# Patient Record
Sex: Female | Born: 1975 | Race: Black or African American | Hispanic: No | Marital: Single | Smoking: Never smoker
Health system: Southern US, Community
[De-identification: ages and names within clinical notes are randomized; demographics above are authoritative.]

## PROBLEM LIST (undated history)

## (undated) DIAGNOSIS — H6690 Otitis media, unspecified, unspecified ear: Secondary | ICD-10-CM

## (undated) DIAGNOSIS — E119 Type 2 diabetes mellitus without complications: Secondary | ICD-10-CM

## (undated) HISTORY — PX: OTHER SURGICAL HISTORY: SHX169

## (undated) HISTORY — PX: FRACTURE SURGERY: SHX138

---

## 2013-03-12 ENCOUNTER — Encounter (HOSPITAL_COMMUNITY): Payer: Self-pay | Admitting: *Deleted

## 2013-03-12 ENCOUNTER — Emergency Department (HOSPITAL_COMMUNITY)
Admission: EM | Admit: 2013-03-12 | Discharge: 2013-03-12 | Disposition: A | Discharge status: Home / Self Care | Payer: Managed Care, Other (non HMO) | Attending: Emergency Medicine | Admitting: Emergency Medicine

## 2013-03-12 ENCOUNTER — Emergency Department (HOSPITAL_COMMUNITY): Payer: Managed Care, Other (non HMO)

## 2013-03-12 DIAGNOSIS — R52 Pain, unspecified: Secondary | ICD-10-CM | POA: Insufficient documentation

## 2013-03-12 DIAGNOSIS — Z8669 Personal history of other diseases of the nervous system and sense organs: Secondary | ICD-10-CM | POA: Insufficient documentation

## 2013-03-12 DIAGNOSIS — M25512 Pain in left shoulder: Secondary | ICD-10-CM

## 2013-03-12 DIAGNOSIS — M25519 Pain in unspecified shoulder: Secondary | ICD-10-CM | POA: Insufficient documentation

## 2013-03-12 HISTORY — DX: Otitis media, unspecified, unspecified ear: H66.90

## 2013-03-12 LAB — COMPREHENSIVE METABOLIC PANEL
AST: 12 U/L (ref 0–37)
Albumin: 3.3 g/dL — ABNORMAL LOW (ref 3.5–5.2)
Alkaline Phosphatase: 101 U/L (ref 39–117)
BUN: 8 mg/dL (ref 6–23)
Potassium: 3.7 mEq/L (ref 3.5–5.1)
Total Protein: 8.1 g/dL (ref 6.0–8.3)

## 2013-03-12 LAB — CBC WITH DIFFERENTIAL/PLATELET
Basophils Absolute: 0 10*3/uL (ref 0.0–0.1)
Basophils Relative: 0 % (ref 0–1)
Eosinophils Absolute: 0.3 10*3/uL (ref 0.0–0.7)
Hemoglobin: 10.8 g/dL — ABNORMAL LOW (ref 12.0–15.0)
MCH: 26.7 pg (ref 26.0–34.0)
MCHC: 34.2 g/dL (ref 30.0–36.0)
Monocytes Relative: 8 % (ref 3–12)
Neutrophils Relative %: 46 % (ref 43–77)
Platelets: 300 10*3/uL (ref 150–400)
RDW: 13.3 % (ref 11.5–15.5)

## 2013-03-12 LAB — POCT I-STAT TROPONIN I: Troponin i, poc: 0 ng/mL (ref 0.00–0.08)

## 2013-03-12 MED ORDER — OXYCODONE-ACETAMINOPHEN 5-325 MG PO TABS: 1.0000 | ORAL_TABLET | Freq: Four times a day (QID) | ORAL | Status: DC | PRN

## 2013-03-12 MED ORDER — OXYCODONE-ACETAMINOPHEN 5-325 MG PO TABS
2.0000 | ORAL_TABLET | Freq: Once | ORAL | Status: AC
  Administered 2013-03-12: 2 via ORAL
  Filled 2013-03-12: qty 2

## 2013-03-12 MED ORDER — IBUPROFEN 800 MG PO TABS: 800.0000 mg | ORAL_TABLET | Freq: Three times a day (TID) | ORAL | Status: DC

## 2013-03-12 NOTE — ED Provider Notes (Signed)
CSN: [id number]     Arrival date & time 03/12/13  0004 History     First MD Initiated Contact with Patient 03/12/13 0110     Chief Complaint  Patient presents with  . Chest Pain   (Consider location/radiation/quality/duration/timing/severity/associated sxs/prior Treatment) Patient is a 37 y.o. female presenting with chest pain.  Chest Pain   Pt reports several hours of moderate to severe aching pain in L chest radiating into her back, worse with deep breath. States she had similar pain before attributed to ear infection and then clarifies that her pain is not really in her chest but more in her L shoulder. She denies any SOB or fever. No ear drainage but some recent sore throat. She denies any falls or injury to the shoulder. States she has taken motrin with no improvement.   Past Medical History  Diagnosis Date  . Ear infection    Past Surgical History  Procedure Laterality Date  . Arm surgery      right    History reviewed. No pertinent family history. History  Substance Use Topics  . Smoking status: Never Smoker   . Smokeless tobacco: Not on file  . Alcohol Use: No   OB History   Grav Para Term Preterm Abortions TAB SAB Ect Mult Living                 Review of Systems  Cardiovascular: Positive for chest pain.   All other systems reviewed and are negative except as noted in HPI.   Allergies  Shellfish allergy  Home Medications   Current Outpatient Rx  Name  Route  Sig  Dispense  Refill  . ibuprofen (ADVIL,MOTRIN) 200 MG tablet   Oral   Take 400 mg by mouth every 6 (six) hours as needed for pain.          BP 118/83  Pulse 88  Temp(Src) 98.2 F (36.8 C) (Oral)  Resp 16  SpO2 100%  LMP 02/26/2013 Physical Exam  Nursing note and vitals reviewed. Constitutional: She is oriented to person, place, and time. She appears well-developed and well-nourished.  HENT:  Head: Normocephalic and atraumatic.  Eyes: EOM are normal. Pupils are equal, round, and  reactive to light.  Neck: Normal range of motion. Neck supple.  Cardiovascular: Normal rate, normal heart sounds and intact distal pulses.   Pulmonary/Chest: Effort normal and breath sounds normal.  Abdominal: Bowel sounds are normal. She exhibits no distension. There is no tenderness.  Musculoskeletal: Normal range of motion. She exhibits tenderness (tender over soft tissues of L shoulder, cervical and thoracic paraspinal muscles). She exhibits no edema.  Neurological: She is alert and oriented to person, place, and time. She has normal strength. No cranial nerve deficit or sensory deficit.  Skin: Skin is warm and dry. No rash noted.  Psychiatric: She has a normal mood and affect.    ED Course   Procedures (including critical care time)  Labs Reviewed  CBC WITH DIFFERENTIAL - Abnormal; Notable for the following:    Hemoglobin 10.8 (*)    HCT 31.6 (*)    All other components within normal limits  COMPREHENSIVE METABOLIC PANEL - Abnormal; Notable for the following:    Albumin 3.3 (*)    All other components within normal limits  POCT I-STAT TROPONIN I   Dg Chest 2 View  03/12/2013   *RADIOLOGY REPORT*  Clinical Data: Chest pain and shortness of breath tonight.  CHEST - 2 VIEW  Comparison: None.  Findings:  Shallow inspiration. The heart size and pulmonary vascularity are normal. The lungs appear clear and expanded without focal air space disease or consolidation. No blunting of the costophrenic angles.  No pneumothorax.  Mediastinal contours appear intact.  IMPRESSION: Shallow inspiration.  No evidence of active pulmonary disease.   Original Report Authenticated By: Burman Nieves, M.D.   1. Shoulder pain, acute, left     MDM   Date: 03/12/2013  Rate: 83  Rhythm: normal sinus rhythm  QRS Axis: normal  Intervals: normal  ST/T Wave abnormalities: normal  Conduction Disutrbances: none  Narrative Interpretation: unremarkable  Pt with clear musculoskeletal shoulder pain does not  actually have any chest pain. Labs and images ordered in triage reviewed and unremarkable. Will give pain medications, advised continued NSAIDs, PCP followup for recheck.     Olyvia Gopal B. Bernette Mayers, MD 03/12/13 1610

## 2013-03-12 NOTE — ED Notes (Signed)
MD at bedside. 

## 2013-03-12 NOTE — ED Notes (Signed)
Pt states CP that started today and got worse this evening. Pt states pain is left chest and radiates through to her back. Pt states that the pain gets worse with movement and pressure.

## 2013-03-12 NOTE — ED Notes (Signed)
Patient has history of inner ear infections, no history of heart disease or diabetes,

## 2013-07-22 ENCOUNTER — Encounter (HOSPITAL_COMMUNITY): Payer: Self-pay | Admitting: *Deleted

## 2013-07-30 ENCOUNTER — Encounter (HOSPITAL_COMMUNITY): Payer: Self-pay | Admitting: Pharmacist

## 2013-07-30 ENCOUNTER — Other Ambulatory Visit: Payer: Self-pay | Admitting: Obstetrics and Gynecology

## 2013-07-31 ENCOUNTER — Encounter: Payer: Managed Care, Other (non HMO) | Attending: *Deleted | Admitting: *Deleted

## 2013-08-06 ENCOUNTER — Encounter (HOSPITAL_COMMUNITY)
Admission: RE | Admit: 2013-08-06 | Discharge: 2013-08-06 | Disposition: A | Discharge status: Home / Self Care | Payer: Managed Care, Other (non HMO) | Source: Ambulatory Visit | Attending: Obstetrics and Gynecology | Admitting: Obstetrics and Gynecology

## 2013-08-06 ENCOUNTER — Inpatient Hospital Stay (HOSPITAL_COMMUNITY): Admission: RE | Admit: 2013-08-06 | Payer: Managed Care, Other (non HMO) | Source: Ambulatory Visit

## 2013-08-06 ENCOUNTER — Encounter (INDEPENDENT_AMBULATORY_CARE_PROVIDER_SITE_OTHER): Payer: Self-pay

## 2013-08-06 ENCOUNTER — Encounter (HOSPITAL_COMMUNITY): Payer: Self-pay

## 2013-08-06 DIAGNOSIS — Z01818 Encounter for other preprocedural examination: Secondary | ICD-10-CM | POA: Insufficient documentation

## 2013-08-06 DIAGNOSIS — Z01812 Encounter for preprocedural laboratory examination: Secondary | ICD-10-CM | POA: Insufficient documentation

## 2013-08-06 LAB — BASIC METABOLIC PANEL
BUN: 8 mg/dL (ref 6–23)
CALCIUM: 9.3 mg/dL (ref 8.4–10.5)
CO2: 27 meq/L (ref 19–32)
CREATININE: 0.79 mg/dL (ref 0.50–1.10)
Chloride: 103 mEq/L (ref 96–112)
GFR calc Af Amer: >90 mL/min (ref >90)
GLUCOSE: 81 mg/dL (ref 70–99)
Potassium: 3.8 mEq/L (ref 3.7–5.3)
SODIUM: 142 meq/L (ref 137–147)

## 2013-08-06 LAB — CBC
HCT: 29.3 % — ABNORMAL LOW (ref 36.0–46.0)
Hemoglobin: 10 g/dL — ABNORMAL LOW (ref 12.0–15.0)
MCH: 27 pg (ref 26.0–34.0)
MCHC: 34.1 g/dL (ref 30.0–36.0)
MCV: 79 fL (ref 78.0–100.0)
PLATELETS: 311 10*3/uL (ref 150–400)
RBC: 3.71 MIL/uL — AB (ref 3.87–5.11)
RDW: 13 % (ref 11.5–15.5)
WBC: 7.2 10*3/uL (ref 4.0–10.5)

## 2013-08-06 NOTE — Patient Instructions (Signed)
21 Pamela Cowan  08/06/2013   Your procedure is scheduled on:  08/07/13  Enter through the Main Entrance of [facility] at 815 AM.  Pick up the phone at the desk and dial [phone].   Call this number if you have problems the morning of surgery: [phone]   Remember:   Do not eat food:After Midnight.  Do not drink clear liquids: After Midnight.  Take these medicines the morning of surgery with A SIP OF WATER: NA   Do not wear jewelry, make-up or nail polish.  Do not wear lotions, powders, or perfumes. You may wear deodorant.  Do not shave 48 hours prior to surgery.  Do not bring valuables to the hospital.  [facility] is not   responsible for any belongings or valuables brought to the hospital.  Contacts, dentures or bridgework may not be worn into surgery.  Leave suitcase in the car. After surgery it may be brought to your room.  For patients admitted to the hospital, checkout time is 11:00 AM the day of              discharge.   Patients discharged the day of surgery will not be allowed to drive             home.  Name and phone number of your driver: mother   Pamela Cowan  Special Instructions:   Shower using CHG 2 nights before surgery and the night before surgery.  If you shower the day of surgery use CHG.  Use special wash - you have one bottle of CHG for all showers.  You should use approximately 1/3 of the bottle for each shower.   Please read over the following fact sheets that you were given:   Surgical Site Infection Prevention

## 2013-08-07 ENCOUNTER — Encounter (HOSPITAL_COMMUNITY): Admission: RE | Payer: Self-pay | Source: Ambulatory Visit

## 2013-08-07 ENCOUNTER — Ambulatory Visit (HOSPITAL_COMMUNITY)
Admission: RE | Admit: 2013-08-07 | Payer: Managed Care, Other (non HMO) | Source: Ambulatory Visit | Admitting: Obstetrics and Gynecology

## 2013-08-07 HISTORY — DX: Type 2 diabetes mellitus without complications: E11.9

## 2013-08-07 SURGERY — DILATATION & CURETTAGE/HYSTEROSCOPY WITH RESECTOCOPE
Anesthesia: Choice

## 2013-09-01 ENCOUNTER — Encounter (HOSPITAL_COMMUNITY): Payer: Self-pay | Admitting: Emergency Medicine

## 2013-09-01 ENCOUNTER — Emergency Department (HOSPITAL_COMMUNITY)
Admission: EM | Admit: 2013-09-01 | Discharge: 2013-09-01 | Disposition: A | Discharge status: Home / Self Care | Payer: Managed Care, Other (non HMO) | Attending: Emergency Medicine | Admitting: Emergency Medicine

## 2013-09-01 DIAGNOSIS — E119 Type 2 diabetes mellitus without complications: Secondary | ICD-10-CM | POA: Insufficient documentation

## 2013-09-01 DIAGNOSIS — Z3202 Encounter for pregnancy test, result negative: Secondary | ICD-10-CM | POA: Insufficient documentation

## 2013-09-01 DIAGNOSIS — N12 Tubulo-interstitial nephritis, not specified as acute or chronic: Secondary | ICD-10-CM | POA: Insufficient documentation

## 2013-09-01 DIAGNOSIS — Z8669 Personal history of other diseases of the nervous system and sense organs: Secondary | ICD-10-CM | POA: Insufficient documentation

## 2013-09-01 DIAGNOSIS — Z79899 Other long term (current) drug therapy: Secondary | ICD-10-CM | POA: Insufficient documentation

## 2013-09-01 DIAGNOSIS — N390 Urinary tract infection, site not specified: Secondary | ICD-10-CM | POA: Insufficient documentation

## 2013-09-01 LAB — URINE MICROSCOPIC-ADD ON

## 2013-09-01 LAB — URINALYSIS, ROUTINE W REFLEX MICROSCOPIC
BILIRUBIN URINE: NEGATIVE
Glucose, UA: NEGATIVE mg/dL
KETONES UR: NEGATIVE mg/dL
NITRITE: NEGATIVE
PROTEIN: NEGATIVE mg/dL
Specific Gravity, Urine: 1.016 (ref 1.005–1.030)
UROBILINOGEN UA: 0.2 mg/dL (ref 0.0–1.0)
pH: 5.5 (ref 5.0–8.0)

## 2013-09-01 LAB — POCT PREGNANCY, URINE: Preg Test, Ur: NEGATIVE

## 2013-09-01 MED ORDER — IBUPROFEN 800 MG PO TABS
800.0000 mg | ORAL_TABLET | Freq: Once | ORAL | Status: AC
  Administered 2013-09-01: 800 mg via ORAL
  Filled 2013-09-01: qty 1

## 2013-09-01 MED ORDER — CIPROFLOXACIN HCL 500 MG PO TABS: 500.0000 mg | ORAL_TABLET | Freq: Two times a day (BID) | ORAL | Status: DC

## 2013-09-01 MED ORDER — HYDROCODONE-ACETAMINOPHEN 5-325 MG PO TABS: 1.0000 | ORAL_TABLET | Freq: Four times a day (QID) | ORAL | Status: DC | PRN

## 2013-09-01 NOTE — Discharge Instructions (Signed)
Urinary Tract Infection  Urinary tract infections (UTIs) can develop anywhere along your urinary tract. Your urinary tract is your body's drainage system for removing wastes and extra water. Your urinary tract includes two kidneys, two ureters, a bladder, and a urethra. Your kidneys are a pair of bean-shaped organs. Each kidney is about the size of your fist. They are located below your ribs, one on each side of your spine.  CAUSES  Infections are caused by microbes, which are microscopic organisms, including fungi, viruses, and bacteria. These organisms are so small that they can only be seen through a microscope. Bacteria are the microbes that most commonly cause UTIs.  SYMPTOMS   Symptoms of UTIs may vary by age and gender of the patient and by the location of the infection. Symptoms in young women typically include a frequent and intense urge to urinate and a painful, burning feeling in the bladder or urethra during urination. Older women and men are more likely to be tired, shaky, and weak and have muscle aches and abdominal pain. A fever may mean the infection is in your kidneys. Other symptoms of a kidney infection include pain in your back or sides below the ribs, nausea, and vomiting.  DIAGNOSIS  To diagnose a UTI, your caregiver will ask you about your symptoms. Your caregiver also will ask to provide a urine sample. The urine sample will be tested for bacteria and white blood cells. White blood cells are made by your body to help fight infection.  TREATMENT   Typically, UTIs can be treated with medication. Because most UTIs are caused by a bacterial infection, they usually can be treated with the use of antibiotics. The choice of antibiotic and length of treatment depend on your symptoms and the type of bacteria causing your infection.  HOME CARE INSTRUCTIONS   If you were prescribed antibiotics, take them exactly as your caregiver instructs you. Finish the medication even if you feel better after you  have only taken some of the medication.   Drink enough water and fluids to keep your urine clear or pale yellow.   Avoid caffeine, tea, and carbonated beverages. They tend to irritate your bladder.   Empty your bladder often. Avoid holding urine for long periods of time.   Empty your bladder before and after sexual intercourse.   After a bowel movement, women should cleanse from front to back. Use each tissue only once.  SEEK MEDICAL CARE IF:    You have back pain.   You develop a fever.   Your symptoms do not begin to resolve within 3 days.  SEEK IMMEDIATE MEDICAL CARE IF:    You have severe back pain or lower abdominal pain.   You develop chills.   You have nausea or vomiting.   You have continued burning or discomfort with urination.  MAKE SURE YOU:    Understand these instructions.   Will watch your condition.   Will get help right away if you are not doing well or get worse.  Document Released: 04/20/2005 Document Revised: 01/10/2012 Document Reviewed: 08/19/2011  Exit[employer] Patient Information 2014 [employer].

## 2013-09-01 NOTE — ED Notes (Signed)
Signature pad in room not working, pt states she understands discharge instructions and has no further questions at this time.

## 2013-09-01 NOTE — ED Notes (Signed)
Pt presents to department for evaluation of lower back pain and dark colored urine. Ongoing x1 day. Pt concerned that she might have UTI. Denies hematuria/dysuria. 5/10 pain at the time. Pt is alert and oriented x4. No signs of distress noted.

## 2013-09-01 NOTE — ED Provider Notes (Signed)
CSN: [id number]     Arrival date & time 09/01/13  1740 History   First MD Initiated Contact with Patient 09/01/13 1841     Chief Complaint  Patient presents with  . Back Pain   (Consider location/radiation/quality/duration/timing/severity/associated sxs/prior Treatment) Patient is a 38 y.o. female presenting with back pain. The history is provided by the patient.  Back Pain Location:  Lumbar spine Quality:  Aching Radiates to:  Does not radiate Pain severity:  Mild Pain is:  Same all the time Onset quality:  Gradual Duration:  1 day Timing:  Constant Progression:  Worsening Chronicity:  Recurrent Context: not falling and not recent injury   Relieved by:  Nothing Worsened by:  Nothing tried Associated symptoms: abdominal pain   Associated symptoms: no dysuria and no fever     Past Medical History  Diagnosis Date  . Ear infection   . Diabetes mellitus without complication     pt just put on metformin   Past Surgical History  Procedure Laterality Date  . Arm surgery      right    No family history on file. History  Substance Use Topics  . Smoking status: Never Smoker   . Smokeless tobacco: Not on file  . Alcohol Use: No   OB History   Grav Para Term Preterm Abortions TAB SAB Ect Mult Living                 Review of Systems  Constitutional: Negative for fever.  Respiratory: Negative for cough and shortness of breath.   Gastrointestinal: Positive for abdominal pain. Negative for nausea, vomiting and diarrhea.  Genitourinary: Positive for vaginal discharge. Negative for dysuria and vaginal bleeding.  Musculoskeletal: Positive for back pain.  All other systems reviewed and are negative.    Allergies  Shellfish allergy  Home Medications   Current Outpatient Rx  Name  Route  Sig  Dispense  Refill  . ciprofloxacin (CIPRO) 500 MG tablet   Oral   Take 1 tablet (500 mg total) by mouth 2 (two) times daily. One po bid x 7 days   20 tablet   0   .  HYDROcodone-acetaminophen (NORCO/VICODIN) 5-325 MG per tablet   Oral   Take 1 tablet by mouth every 6 (six) hours as needed for moderate pain.   20 tablet   0   . ibuprofen (ADVIL,MOTRIN) 200 MG tablet   Oral   Take 400 mg by mouth every 6 (six) hours as needed for headache.          . metFORMIN (GLUCOPHAGE) 500 MG tablet   Oral   Take 500 mg by mouth daily before supper.          BP 126/86  Pulse 79  Temp(Src) 98.5 F (36.9 C) (Oral)  Resp 18  Ht 5\' 6"  (1.676 m)  Wt 276 lb (125.193 kg)  BMI 44.57 kg/m2  SpO2 100%  LMP 07/29/2013 Physical Exam  Nursing note and vitals reviewed. Constitutional: She is oriented to person, place, and time. She appears well-developed and well-nourished. No distress.  HENT:  Head: Normocephalic and atraumatic.  Eyes: EOM are normal. Pupils are equal, round, and reactive to light.  Neck: Normal range of motion. Neck supple.  Cardiovascular: Normal rate and regular rhythm.  Exam reveals no friction rub.   No murmur heard. Pulmonary/Chest: Effort normal and breath sounds normal. No respiratory distress. She has no wheezes. She has no rales.  Abdominal: Soft. She exhibits no distension. There is  tenderness (mild CVA tenderness on left). There is no rebound.  Musculoskeletal: Normal range of motion. She exhibits no edema.  Neurological: She is alert and oriented to person, place, and time. No cranial nerve deficit. She exhibits normal muscle tone. Coordination normal.  Skin: No rash noted. She is not diaphoretic.    ED Course  Procedures (including critical care time) Labs Review Labs Reviewed  URINALYSIS, ROUTINE W REFLEX MICROSCOPIC - Abnormal; Notable for the following:    APPearance CLOUDY (*)    Hgb urine dipstick TRACE (*)    Leukocytes, UA LARGE (*)    All other components within normal limits  URINE MICROSCOPIC-ADD ON - Abnormal; Notable for the following:    Squamous Epithelial / LPF FEW (*)    Bacteria, UA MANY (*)    All  other components within normal limits  URINE CULTURE  POCT PREGNANCY, URINE   Imaging Review No results found.  EKG Interpretation   None       MDM   1. UTI (lower urinary tract infection)   2. Pyelonephritis    44F here with L sided back pain. No fever, no N/V/D. States dark urine. She is concerned about UTI. Hx of similar. Also having vaginal discharge. Denies belly pain. L CVA tenderness on exam. Stable vitals. Urine shows UTI. Given Cipro. I offered pelvic for her vaginal discharge, she refused, has f/u with her OB/GYN this week. Given Cipro and vicodin. Stable for discharge.     Dagmar HaitWilliam Michelle Vanhise, MD 09/01/13 [phone]

## 2013-09-02 LAB — URINE CULTURE: Colony Count: >=100000

## 2013-11-19 ENCOUNTER — Encounter: Payer: Managed Care, Other (non HMO) | Attending: Obstetrics and Gynecology | Admitting: Dietician

## 2013-11-19 VITALS — Ht 69.0 in | Wt 281.1 lb

## 2013-11-19 DIAGNOSIS — Z6841 Body Mass Index (BMI) 40.0 and over, adult: Secondary | ICD-10-CM | POA: Insufficient documentation

## 2013-11-19 DIAGNOSIS — Z833 Family history of diabetes mellitus: Secondary | ICD-10-CM | POA: Insufficient documentation

## 2013-11-19 DIAGNOSIS — E669 Obesity, unspecified: Secondary | ICD-10-CM | POA: Insufficient documentation

## 2013-11-19 DIAGNOSIS — Z713 Dietary counseling and surveillance: Secondary | ICD-10-CM | POA: Insufficient documentation

## 2013-11-19 NOTE — Patient Instructions (Addendum)
-  Try to start eating breakfast and having snacks throughout the day  [place] yogurt, protein bars, cottage cheese and fruit, nuts (refer to snack list) -Fill up on non-starchy vegetables (any veggie except corn, peas, or potatoes) -Practice mindful eating: pay attention to early hunger signals, stop eating once you aren't hungry anymore, practice eating slowly -Self care: take a walk, listening to music, hanging out with new friends (brainstorm some other ways to relax) -Look into a gym with Zumba classes [employer] and [employer])

## 2013-11-19 NOTE — Progress Notes (Signed)
  Medical Nutrition Therapy:  Appt start time: 1500 end time:  1600.   Assessment:  Primary concerns today: "Pamela Cowan"  Is here today because her doctor referred her for weight loss. She states that diabetes runs in her family and she wants to prevent the onset of diabetes. Pam[place] Cow[place], [place]. In the past few months she has lost 3 people close to her. She works at [employer] and [employer]. She states she is an emotional eater with both negative and positive emotions.   Preferred Learning Style:  No preference indicated   Learning Readiness:  Contemplating  Ready  MEDICATIONS: see list   DIETARY INTAKE:  Often does not eat until 2pm.  24-hr recall:  B ( AM): none  Snk ( AM): none  L (2 PM): spicy chicken sandwich and fries Snk ( PM): candy, chips, and other "junk food"  Beverages: juice, water  Usual physical activity: none  Estimated energy needs: 1800 calories 200 g carbohydrates   Progress Towards Goal(s):  No progress.   Nutritional Diagnosis:  [place]-3.3 Overweight/obesity As related to inappropriate food choices and physical inactivity.  As evidenced by BMI 41.6.    Intervention:  Nutrition counseling provided. Explained etiology and methods of preventing onset of diabetes. -Try to start eating breakfast and having snacks throughout the day  [place] yogurt, protein bars, cottage cheese and fruit, nuts (refer to snack list) -Fill up on non-starchy vegetables (any veggie except corn, peas, or potatoes) -Practice mindful eating: pay attention to early hunger signals, stop eating once you aren't hungry anymore, practice eating slowly -Self care: take a walk, listening to music, hanging out with new friends (brainstorm some other ways to relax) -Look into a gym with Zumba classes Engineer, [employer] and [employer])  Teaching Method Utilized: Scientific laboratory technicianVisual Auditory Hands on  Handouts given during visit include:  15g CHO + protein snacks  Barriers to  learning/adherence to lifestyle change: lack of social support  Demonstrated degree of understanding via:  Teach Back   Monitoring/Evaluation:  Dietary intake, exercise, and body weight in 2 month(s).

## 2014-01-20 ENCOUNTER — Ambulatory Visit: Payer: Managed Care, Other (non HMO) | Admitting: Dietician

## 2014-07-26 ENCOUNTER — Emergency Department (HOSPITAL_COMMUNITY)
Admission: EM | Admit: 2014-07-26 | Discharge: 2014-07-27 | Disposition: A | Discharge status: Home / Self Care | Payer: Managed Care, Other (non HMO) | Attending: Emergency Medicine | Admitting: Emergency Medicine

## 2014-07-26 ENCOUNTER — Encounter (HOSPITAL_COMMUNITY): Payer: Self-pay | Admitting: Emergency Medicine

## 2014-07-26 DIAGNOSIS — E119 Type 2 diabetes mellitus without complications: Secondary | ICD-10-CM | POA: Insufficient documentation

## 2014-07-26 DIAGNOSIS — H9209 Otalgia, unspecified ear: Secondary | ICD-10-CM | POA: Insufficient documentation

## 2014-07-26 DIAGNOSIS — K047 Periapical abscess without sinus: Secondary | ICD-10-CM | POA: Insufficient documentation

## 2014-07-26 NOTE — ED Notes (Signed)
Pt c/o lt sided dental pain, lt sided otalgia and sore throat since yesterday.

## 2014-07-27 MED ORDER — HYDROCODONE-ACETAMINOPHEN 5-325 MG PO TABS
2.0000 | ORAL_TABLET | Freq: Once | ORAL | Status: AC
  Administered 2014-07-27: 2 via ORAL
  Filled 2014-07-27: qty 2

## 2014-07-27 MED ORDER — PENICILLIN V POTASSIUM 500 MG PO TABS: 500.0000 mg | ORAL_TABLET | Freq: Four times a day (QID) | ORAL | Status: AC

## 2014-07-27 MED ORDER — PENICILLIN V POTASSIUM 500 MG PO TABS
500.0000 mg | ORAL_TABLET | Freq: Once | ORAL | Status: AC
  Administered 2014-07-27: 500 mg via ORAL
  Filled 2014-07-27: qty 1

## 2014-07-27 MED ORDER — NAPROXEN 500 MG PO TABS: 500.0000 mg | ORAL_TABLET | Freq: Two times a day (BID) | ORAL | Status: DC

## 2014-07-27 MED ORDER — HYDROCODONE-ACETAMINOPHEN 5-325 MG PO TABS: 1.0000 | ORAL_TABLET | Freq: Four times a day (QID) | ORAL | Status: DC | PRN

## 2014-07-27 NOTE — ED Notes (Signed)
Pt with lt sided facial swelling and dental pain. Pain radiates to lf ear and throat. Pain progressed since yesterday.

## 2014-07-27 NOTE — ED Provider Notes (Signed)
CSN: [id number]     Arrival date & time 07/26/14  2315 History   First MD Initiated Contact with Patient 07/27/14 0017     Chief Complaint  Patient presents with  . Dental Pain  . Otalgia  . Sore Throat     (Consider location/radiation/quality/duration/timing/severity/associated sxs/prior Treatment) Patient is a 39 y.o. female presenting with tooth pain, ear pain, and pharyngitis. The history is provided by the patient. No language interpreter was used.  Dental Pain Location:  Lower Lower teeth location:  19/LL 1st molar Quality:  Throbbing Severity:  Moderate Onset quality:  Gradual Duration: 2 days. Timing:  Constant Progression:  Worsening Chronicity:  New Relieved by: relieved yesterday with ibuprofen; no relief of symptoms today. Worsened by:  Touching and pressure Associated symptoms: facial pain and facial swelling (lymph nodes)   Associated symptoms: no difficulty swallowing, no drooling, no fever, no oral bleeding, no oral lesions and no trismus   Otalgia Associated symptoms: sore throat   Associated symptoms: no fever   Sore Throat Associated symptoms include a sore throat. Pertinent negatives include no fever.    Past Medical History  Diagnosis Date  . Ear infection   . Diabetes mellitus without complication     pt just put on metformin   Past Surgical History  Procedure Laterality Date  . Arm surgery      right    No family history on file. History  Substance Use Topics  . Smoking status: Never Smoker   . Smokeless tobacco: Not on file  . Alcohol Use: No   OB History    No data available      Review of Systems  Constitutional: Negative for fever.  HENT: Positive for ear pain (referred from tooth), facial swelling (lymph nodes) and sore throat. Negative for drooling, mouth sores and trouble swallowing.   All other systems reviewed and are negative.   Allergies  Shellfish allergy  Home Medications   Prior to Admission medications     Medication Sig Start Date End Date Taking? Authorizing Provider  acetaminophen (TYLENOL) 500 MG tablet Take 1,000 mg by mouth every 6 (six) hours as needed for moderate pain.   Yes Historical Provider, MD  ciprofloxacin (CIPRO) 500 MG tablet Take 1 tablet (500 mg total) by mouth 2 (two) times daily. One po bid x 7 days Patient not taking: Reported on 07/26/2014 09/01/13   Elwin Mocha, MD  HYDROcodone-acetaminophen (NORCO/VICODIN) 5-325 MG per tablet Take 1-2 tablets by mouth every 6 (six) hours as needed for moderate pain. 07/27/14   Antony Madura, PA-C  naproxen (NAPROSYN) 500 MG tablet Take 1 tablet (500 mg total) by mouth 2 (two) times daily. 07/27/14   Antony Madura, PA-C  penicillin v potassium (VEETID) 500 MG tablet Take 1 tablet (500 mg total) by mouth 4 (four) times daily. 07/27/14 08/03/14  Antony Madura, PA-C   BP 119/73 mmHg  Pulse 83  Temp(Src) 98.2 F (36.8 C) (Oral)  Resp 18  Ht  (1.753 m)  Wt 274 lb (124.286 kg)  BMI 40.44 kg/m2  SpO2 100% Physical Exam  Constitutional: She is oriented to person, place, and time. She appears well-developed and well-nourished. No distress.  Nontoxic/nonseptic appearing  HENT:  Head: Normocephalic and atraumatic.  Mouth/Throat: Uvula is midline, oropharynx is clear and moist and mucous membranes are normal. No oral lesions. No trismus in the jaw. Dental abscesses present. No uvula swelling. No oropharyngeal exudate.    Swelling to the medial aspect of the left  lower first molar with mild fluctuance and tenderness. Suspect early abscess. Uvula midline. Patient tolerating secretions without difficulty. No abnormal swelling under tongue.  Eyes: Conjunctivae and EOM are normal. No scleral icterus.  Neck: Normal range of motion.  No nuchal rigidity or meningismus. Tender anterior cervical lymphadenopathy on left side. No other swelling of neck; neck girth is large at baseline secondary to body habitus; no erythema or heat to touch.  Pulmonary/Chest:  Effort normal. No respiratory distress.  Respirations even and unlabored  Musculoskeletal: Normal range of motion.  Lymphadenopathy:    She has cervical adenopathy.  Neurological: She is alert and oriented to person, place, and time.  Skin: Skin is warm and dry. No rash noted. She is not diaphoretic. No erythema. No pallor.  Psychiatric: She has a normal mood and affect. Her behavior is normal.  Nursing note and vitals reviewed.   ED Course  Procedures (including critical care time) Labs Review Labs Reviewed - No data to display  Imaging Review No results found.   EKG Interpretation None      MDM   Final diagnoses:  Dental abscess    Nontoxic/nonseptic appearing 39 y/o female with toothache x 2 days with referred pain to R ear and the sensation of a sore throat. Signs of early abscess at base of L lower first molar. No sublingual swelling. Exam today is not for Ludwig's angina or spread of infection. Will treat with penicillin and pain medicine. Urged patient to follow-up with dentist. Referral and resource guide provided. Patient agreeable to plan with no unaddressed concerns.   Filed Vitals:   07/26/14 2323 07/27/14 0051  BP: 119/73 112/64  Pulse: 83 91  Temp: 98.2 F (36.8 C) 97.9 F (36.6 C)  TempSrc: Oral Oral  Resp: 18 16  Height:  (1.753 m)   Weight: 274 lb (124.286 kg)   SpO2: 100% 99%       Antony Madura, PA-C 07/27/14 0103  Olivia Mackie, MD 07/27/14 [id number]

## 2014-07-27 NOTE — Discharge Instructions (Signed)
Dental Abscess °A dental abscess is a collection of infected fluid (pus) from a bacterial infection in the inner part of the tooth (pulp). It usually occurs at the end of the tooth's root.  °CAUSES  °· Severe tooth decay. °· Trauma to the tooth that allows bacteria to enter into the pulp, such as a broken or chipped tooth. °SYMPTOMS  °· Severe pain in and around the infected tooth. °· Swelling and redness around the abscessed tooth or in the mouth or face. °· Tenderness. °· Pus drainage. °· Bad breath. °· Bitter taste in the mouth. °· Difficulty swallowing. °· Difficulty opening the mouth. °· Nausea. °· Vomiting. °· Chills. °· Swollen neck glands. °DIAGNOSIS  °· A medical and dental history will be taken. °· An examination will be performed by tapping on the abscessed tooth. °· X-rays may be taken of the tooth to identify the abscess. °TREATMENT °The goal of treatment is to eliminate the infection. You may be prescribed antibiotic medicine to stop the infection from spreading. A root canal may be performed to save the tooth. If the tooth cannot be saved, it may be pulled (extracted) and the abscess may be drained.  °HOME CARE INSTRUCTIONS °· Only take over-the-counter or prescription medicines for pain, fever, or discomfort as directed by your caregiver. °· Rinse your mouth (gargle) often with salt water (¼ tsp salt in 8 oz [250 ml] of warm water) to relieve pain or swelling. °· Do not drive after taking pain medicine (narcotics). °· Do not apply heat to the outside of your face. °· Return to your dentist for further treatment as directed. °SEEK MEDICAL CARE IF: °· Your pain is not helped by medicine. °· Your pain is getting worse instead of better. °SEEK IMMEDIATE MEDICAL CARE IF: °· You have a fever or persistent symptoms for more than 2-3 days. °· You have a fever and your symptoms suddenly get worse. °· You have chills or a very bad headache. °· You have problems breathing or swallowing. °· You have trouble  opening your mouth. °· You have swelling in the neck or around the eye. °Document Released: 07/11/2005 Document Revised: 04/04/2012 Document Reviewed: 10/19/2010 °ExitCare® Patient Information ©2015 [employer]. This information is not intended to replace advice given to you by your health care provider. Make sure you discuss any questions you have with your health care provider. ° °Emergency Department Resource Guide °1) Find a Doctor and Pay Out of Pocket °Although you won't have to find out who is covered by your insurance plan, it is a good idea to ask around and get recommendations. You will then need to call the office and see if the doctor you have chosen will accept you as a new patient and what types of options they offer for patients who are self-pay. Some doctors offer discounts or will set up payment plans for their patients who do not have insurance, but you will need to ask so you aren't surprised when you get to your appointment. ° °2) Contact Your Local Health Department °Not all health departments have doctors that can see patients for sick visits, but many do, so it is worth a call to see if yours does. If you don't know where your local health department is, you can check in your phone book. The CDC also has a tool to help you locate your state's health department, and many state websites also have listings of all of their local health departments. ° °3) Find a Walk-in Clinic °  If your illness is not likely to be very severe or complicated, you may want to try a walk in clinic. These are popping up all over the country in pharmacies, drugstores, and shopping centers. They're usually staffed by nurse practitioners or physician assistants that have been trained to treat common illnesses and complaints. They're usually fairly quick and inexpensive. However, if you have serious medical issues or chronic medical problems, these are probably not your best option. ° °No Primary Care Doctor: °- Call  [facility] at  [phone] - they can help you locate a primary care doctor that  accepts your insurance, provides certain services, etc. °- Physician Referral Service- 1-800-533-3463 ° °Chronic Pain Problems: °Organization         Address  Phone   Notes  °[facility]  ([phone]) [phone] Patients need to be referred by their primary care doctor.  ° °Medication Assistance: °Organization         Address  Phone   Notes  °[place] [facility] [address] °[place], [place] [address] (336) 641-8030 --Must be a resident of [place] °-- Must have NO insurance coverage whatsoever (no Medicaid/ Medicare, etc.) °-- The pt. MUST have a primary care doctor that directs their care regularly and follows them in the community °  °[facility]  (866) 331-1348   °[employer]  (888) 892-1162   ° °Agencies that provide inexpensive medical care: °Organization         Address  Phone   Notes  °[facility]  ([phone]) [phone]   °[facility]    (336) 832-7272   °[facility] [address] °[place], [place] [address] (336) 832-4777   °[facility] [address], °[place] (336) [phone]   °[facility]    (336) 373-0678   °[facility]    (336) 272-1050   °[facility] ° [address], [place] Phone:  (336) [phone], Fax:  (336) [phone] Hours of Operation:  9 am - 6 pm, M-F.  Also accepts Medicaid/Medicare and self-pay.  °[facility] ° [address], [place] Phone: (336) [phone], Fax: (336) [phone]. Hours of Operation:  8:30 am - 5:30 pm, M-F.  Also accepts Medicaid and self-pay.  °[facility] [place] [address], [place] Phone: (336) [phone]   °[facility] [address], [place], [place] (336)[phone], Ext. 123 Mondays & Thursdays: 7-9 AM.  First 15 patients are seen on a first come, first serve  basis. °  ° °Medicaid-accepting [place] Providers: ° °Organization         Address  Phone   Notes  °[facility] [address], [place] ([phone]) [phone] Also accepts self-pay patients.  °[facility] [address], [place] ° (336) [phone]   °[facility] [address] [place] [address], [place] (336) [phone]   °[facility] [address] [place] [address], [place] (336) [phone]   °[facility] [address], [place]  ° ([phone]) [phone] Only accepts [place] Access Medicaid patients after they have their name applied to their card.  ° °Self-Pay (no insurance) in [place]: ° °Organization         Address  Phone   Notes  °Sickle Cell Patients, [facility] [address], [place] ([phone])   [phone]   °[facility] [address], [place] (336) [phone]   °[facility] ° [address], [place] (336) [phone]   °[facility]/Dr. Osei-Bonsu ° [address] [place] [address], [place] or [address], [place] ([phone]) [phone] Phone number for both [place] and [place] locations is the same.  °[facility] [address], [place] ([phone]) [phone]   °[facility] [address] [place] [address], [place] or [address] (336) 852-7530 °(336) [phone]   °[facility] [address], [place] (336) 350-1642, phone; (336) [phone], fax Sees patients 1st and 3rd Saturday of every month.  Must not qualify for public or private insurance (i.e. Medicaid, Medicare, [place] Health Choice, Veterans' Benefits) • Household income should be no more than 200% of the poverty level •The clinic cannot treat you if you are pregnant or think you are pregnant • Sexually transmitted diseases are not treated at the clinic.  ° ° °Dental Care: °Organization         Address  Phone  Notes  °[place]  [facility] [address], [place] (336) [phone] Accepts children up to age 21 who are enrolled in Medicaid or [place] Health Choice; pregnant women with a Medicaid card; and children who have applied for Medicaid or [place] Health Choice, but were declined, whose parents can pay a reduced fee at time of service.  °[facility] [place]  [address], [place] (336) [phone] Accepts children up to age 21 who are enrolled in Medicaid or [place] Health Choice; pregnant women with a Medicaid card; and children who have applied for Medicaid or [place] Health Choice, but were declined, whose parents can pay a reduced fee at time of service.  °[facility] ° [address], [place] ([phone]) [phone] Patients are seen by appointment only. Walk-ins are not accepted. [facility] will see patients 18 years of age and older. °Monday - Tuesday (8am-5pm) °Most Wednesdays (8:30-5pm) °$30 per visit, cash only  °[facility] ° [address], [place] ([phone]) [phone] Patients are seen by appointment only. Walk-ins are not accepted. [facility] will see patients 18 years of age and older. °One Wednesday Evening (Monthly: Volunteer Based).  $30 per visit, cash only  °[facility]  ([phone]) [phone] for adults; Children under age 4, call Graduate Pediatric Dentistry at ([phone]) [phone]. Children aged 4-14, please call ([phone]) [phone] to request a pediatric application. ° Dental services are provided in all areas of dental care including fillings, crowns and bridges, complete and partial dentures, implants, gum treatment, root canals, and extractions. Preventive care is also provided. Treatment is provided to both adults and children. °Patients are selected via a lottery and there is often a waiting list. °  °[facility] [address], °[place] ° ([phone]) [phone]  [email or web address] °  °[facility] [address], [place], [place] (336)[phone], Ext. 123 Second and Fourth Thursday of each month, opens at 6:30 AM; Clinic ends at 9 AM.  Patients are seen on a first-come first-served basis, and a limited number are seen during each clinic.  ° °[facility] ° [address], [place], [place] ([phone]) [phone]   Eligibility Requirements °You must have lived in [place], [place], or [place] counties for   at least the last three months. °  You cannot be eligible for state or federal sponsored healthcare insurance, including Veterans Administration, Medicaid, or Medicare. °  You generally cannot be eligible for healthcare insurance through your employer.  °  How to apply: °Eligibility screenings are held every Tuesday and Wednesday afternoon from 1:00 pm until 4:00 pm. You do not need an appointment for the interview!  °[facility] [address], [place], [place] [phone]   °[facility]  [phone]   °[facility]  [phone]   °[facility]  [phone]   ° °

## 2015-09-06 ENCOUNTER — Encounter (HOSPITAL_COMMUNITY): Payer: Self-pay | Admitting: Emergency Medicine

## 2015-09-06 ENCOUNTER — Emergency Department (HOSPITAL_COMMUNITY)
Admission: EM | Admit: 2015-09-06 | Discharge: 2015-09-06 | Disposition: A | Discharge status: Home / Self Care | Payer: Managed Care, Other (non HMO) | Attending: Emergency Medicine | Admitting: Emergency Medicine

## 2015-09-06 DIAGNOSIS — Z791 Long term (current) use of non-steroidal anti-inflammatories (NSAID): Secondary | ICD-10-CM | POA: Insufficient documentation

## 2015-09-06 DIAGNOSIS — E119 Type 2 diabetes mellitus without complications: Secondary | ICD-10-CM | POA: Diagnosis not present

## 2015-09-06 DIAGNOSIS — Z8669 Personal history of other diseases of the nervous system and sense organs: Secondary | ICD-10-CM | POA: Insufficient documentation

## 2015-09-06 DIAGNOSIS — N390 Urinary tract infection, site not specified: Secondary | ICD-10-CM | POA: Diagnosis not present

## 2015-09-06 DIAGNOSIS — R1030 Lower abdominal pain, unspecified: Secondary | ICD-10-CM | POA: Diagnosis present

## 2015-09-06 LAB — URINE MICROSCOPIC-ADD ON

## 2015-09-06 LAB — URINALYSIS, ROUTINE W REFLEX MICROSCOPIC
Bilirubin Urine: NEGATIVE
Glucose, UA: NEGATIVE mg/dL
Hgb urine dipstick: NEGATIVE
KETONES UR: NEGATIVE mg/dL
Nitrite: NEGATIVE
Protein, ur: NEGATIVE mg/dL
Specific Gravity, Urine: 1.016 (ref 1.005–1.030)
pH: 8 (ref 5.0–8.0)

## 2015-09-06 MED ORDER — OXYCODONE-ACETAMINOPHEN 5-325 MG PO TABS
2.0000 | ORAL_TABLET | Freq: Once | ORAL | Status: AC
Start: 2015-09-06 — End: 2015-09-06
  Administered 2015-09-06: 2 via ORAL
  Filled 2015-09-06: qty 2

## 2015-09-06 MED ORDER — ONDANSETRON 8 MG PO TBDP: 8.0000 mg | ORAL_TABLET | Freq: Three times a day (TID) | ORAL | Status: DC | PRN

## 2015-09-06 MED ORDER — SULFAMETHOXAZOLE-TRIMETHOPRIM 800-160 MG PO TABS
1.0000 | ORAL_TABLET | Freq: Two times a day (BID) | ORAL | Status: AC
Start: 2015-09-06 — End: 2015-09-13

## 2015-09-06 MED ORDER — OXYCODONE-ACETAMINOPHEN 5-325 MG PO TABS: 1.0000 | ORAL_TABLET | ORAL | Status: DC | PRN

## 2015-09-06 NOTE — Discharge Instructions (Signed)

## 2015-09-06 NOTE — ED Provider Notes (Signed)
CSN: [id number]     Arrival date & time 09/06/15  1735 History   First MD Initiated Contact with Patient 09/06/15 2107     Chief Complaint  Patient presents with  . Flank Pain     (Consider location/radiation/quality/duration/timing/severity/associated sxs/prior Treatment) HPI Comments: Patient here complaining of right-sided flank pain 4 days with associated dysuria and no hematuria. No reported fevers, vomiting. Pain characterized as dull and persistent and without vaginal bleeding or discharge. Some suprapubic tenderness noted. Seen at the hospital this week for similar symptoms and diagnosed with muscle strain. According to her, she had a negative urinalysis. Symptoms have been persistent and nothing makes them better. No treatment use prior to arrival  Patient is a 39 y.o. female presenting with flank pain. The history is provided by the patient.  Flank Pain    Past Medical History  Diagnosis Date  . Ear infection   . Diabetes mellitus without complication (HCC)     pt just put on metformin   Past Surgical History  Procedure Laterality Date  . Arm surgery      right    No family history on file. Social History  Substance Use Topics  . Smoking status: Never Smoker   . Smokeless tobacco: None  . Alcohol Use: No   OB History    No data available     Review of Systems  Genitourinary: Positive for flank pain.  All other systems reviewed and are negative.     Allergies  Shellfish allergy  Home Medications   Prior to Admission medications   Medication Sig Start Date End Date Taking? Authorizing Provider  acetaminophen (TYLENOL) 500 MG tablet Take 1,000 mg by mouth every 6 (six) hours as needed for moderate pain.    Historical Provider, MD  ciprofloxacin (CIPRO) 500 MG tablet Take 1 tablet (500 mg total) by mouth 2 (two) times daily. One po bid x 7 days Patient not taking: Reported on 07/26/2014 09/01/13   Elwin Mocha, MD  HYDROcodone-acetaminophen (NORCO/VICODIN)  5-325 MG per tablet Take 1-2 tablets by mouth every 6 (six) hours as needed for moderate pain. 07/27/14   Antony Madura, PA-C  naproxen (NAPROSYN) 500 MG tablet Take 1 tablet (500 mg total) by mouth 2 (two) times daily. 07/27/14   Antony Madura, PA-C   BP 127/85 mmHg  Pulse 101  Temp(Src) 98.3 F (36.8 C)  Resp 16  SpO2 99% Physical Exam  Constitutional: She is oriented to person, place, and time. She appears well-developed and well-nourished.  Non-toxic appearance. No distress.  HENT:  Head: Normocephalic and atraumatic.  Eyes: Conjunctivae, EOM and lids are normal. Pupils are equal, round, and reactive to light.  Neck: Normal range of motion. Neck supple. No tracheal deviation present. No thyroid mass present.  Cardiovascular: Normal rate, regular rhythm and normal heart sounds.  Exam reveals no gallop.   No murmur heard. Pulmonary/Chest: Effort normal and breath sounds normal. No stridor. No respiratory distress. She has no decreased breath sounds. She has no wheezes. She has no rhonchi. She has no rales.  Abdominal: Soft. Normal appearance and bowel sounds are normal. She exhibits no distension. There is no tenderness. There is CVA tenderness. There is no rebound.  Musculoskeletal: Normal range of motion. She exhibits no edema or tenderness.  Neurological: She is alert and oriented to person, place, and time. She has normal strength. No cranial nerve deficit or sensory deficit. GCS eye subscore is 4. GCS verbal subscore is 5. GCS motor subscore is  6.  Skin: Skin is warm and dry. No abrasion and no rash noted.  Psychiatric: She has a normal mood and affect. Her speech is normal and behavior is normal.  Nursing note and vitals reviewed.   ED Course  Procedures (including critical care time) Labs Review Labs Reviewed  URINALYSIS, ROUTINE W REFLEX MICROSCOPIC (NOT AT [facility]) - Abnormal; Notable for the following:    APPearance CLOUDY (*)    Leukocytes, UA LARGE (*)    All other components  within normal limits  URINE MICROSCOPIC-ADD ON - Abnormal; Notable for the following:    Squamous Epithelial / LPF 6-30 (*)    Bacteria, UA RARE (*)    All other components within normal limits  POC URINE PREG, ED    Imaging Review No results found. I have personally reviewed and evaluated these images and lab results as part of my medical decision-making.   EKG Interpretation None      MDM   Final diagnoses:  None    Patient to be treated for UTI with possible early polyp nephritis. Return precautions given    Lorre Nick, MD 09/06/15 2126

## 2015-09-06 NOTE — ED Notes (Signed)
Patient presents for right flank pain x4 days. Seen at ED at home three days last week and diagnosed with muscle strain. No relief with tylenol. C/o urinary frequency, pressure, color change.

## 2015-09-10 LAB — URINE CULTURE: Culture: >=100000

## 2015-09-11 ENCOUNTER — Telehealth (HOSPITAL_COMMUNITY): Payer: Self-pay

## 2015-09-11 NOTE — Telephone Encounter (Signed)
Post ED Visit - Positive Culture Follow-up  Culture report reviewed by antimicrobial stewardship pharmacist:   Enzo Bi, Pharm.D.  Celedonio Miyamoto, Pharm.D., BCPS  Garvin Fila, Pharm.D.  Georgina Pillion, Pharm.D., BCPS  [place], [address].D., BCPS, AAHIVP  Estella Husk, Pharm.D., BCPS, AAHIVP  Tennis Must, Pharm.D.  Sherle Poe, [address].D.  Positive urine culture, >/= 100,000 colonies -> Klebsiella Pneumoniae Treated with Sulfa Trimeth, organism sensitive to the same and no further patient follow-up is required at this time.  Pamela Cowan, Pamela Cowan 09/11/2015, 12:02 PM

## 2016-02-04 ENCOUNTER — Ambulatory Visit: Payer: Managed Care, Other (non HMO) | Admitting: Physical Therapy

## 2016-02-16 ENCOUNTER — Ambulatory Visit: Payer: Managed Care, Other (non HMO) | Attending: Obstetrics and Gynecology | Admitting: Physical Therapy

## 2016-05-26 ENCOUNTER — Emergency Department (HOSPITAL_COMMUNITY)
Admission: EM | Admit: 2016-05-26 | Discharge: 2016-05-26 | Disposition: A | Discharge status: Home / Self Care | Payer: Managed Care, Other (non HMO) | Attending: Emergency Medicine | Admitting: Emergency Medicine

## 2016-05-26 ENCOUNTER — Encounter (HOSPITAL_COMMUNITY): Payer: Self-pay | Admitting: Emergency Medicine

## 2016-05-26 DIAGNOSIS — R42 Dizziness and giddiness: Secondary | ICD-10-CM

## 2016-05-26 DIAGNOSIS — Z7984 Long term (current) use of oral hypoglycemic drugs: Secondary | ICD-10-CM | POA: Diagnosis not present

## 2016-05-26 DIAGNOSIS — E119 Type 2 diabetes mellitus without complications: Secondary | ICD-10-CM | POA: Diagnosis not present

## 2016-05-26 LAB — BASIC METABOLIC PANEL
Anion gap: 7 (ref 5–15)
BUN: 11 mg/dL (ref 6–20)
CALCIUM: 9 mg/dL (ref 8.9–10.3)
CO2: 27 mmol/L (ref 22–32)
Chloride: 104 mmol/L (ref 101–111)
Creatinine, Ser: 0.87 mg/dL (ref 0.44–1.00)
GFR calc Af Amer: >60 mL/min (ref >60)
Glucose, Bld: 101 mg/dL — ABNORMAL HIGH (ref 65–99)
POTASSIUM: 3.5 mmol/L (ref 3.5–5.1)
Sodium: 138 mmol/L (ref 135–145)

## 2016-05-26 LAB — CBC WITH DIFFERENTIAL/PLATELET
BASOS ABS: 0 10*3/uL (ref 0.0–0.1)
Basophils Relative: 0 %
Eosinophils Absolute: 0.5 10*3/uL (ref 0.0–0.7)
Eosinophils Relative: 5 %
HCT: 32.5 % — ABNORMAL LOW (ref 36.0–46.0)
Hemoglobin: 11.2 g/dL — ABNORMAL LOW (ref 12.0–15.0)
Lymphocytes Relative: 42 %
Lymphs Abs: 3.8 10*3/uL (ref 0.7–4.0)
MCH: 26.6 pg (ref 26.0–34.0)
MCHC: 34.5 g/dL (ref 30.0–36.0)
MCV: 77.2 fL — ABNORMAL LOW (ref 78.0–100.0)
MONO ABS: 0.7 10*3/uL (ref 0.1–1.0)
Monocytes Relative: 8 %
NEUTROS PCT: 45 %
Neutro Abs: 4 10*3/uL (ref 1.7–7.7)
Platelets: 357 10*3/uL (ref 150–400)
RBC: 4.21 MIL/uL (ref 3.87–5.11)
RDW: 12.9 % (ref 11.5–15.5)
WBC: 9 10*3/uL (ref 4.0–10.5)

## 2016-05-26 LAB — I-STAT BETA HCG BLOOD, ED (MC, WL, AP ONLY)

## 2016-05-26 MED ORDER — SODIUM CHLORIDE 0.9 % IV BOLUS (SEPSIS)
1000.0000 mL | Freq: Once | INTRAVENOUS | Status: AC
  Administered 2016-05-26: 1000 mL via INTRAVENOUS

## 2016-05-26 MED ORDER — NAPROXEN 500 MG PO TABS: 500.0000 mg | ORAL_TABLET | Freq: Two times a day (BID) | ORAL | 0 refills | Status: DC

## 2016-05-26 MED ORDER — MECLIZINE HCL 25 MG PO TABS: 25.0000 mg | ORAL_TABLET | Freq: Three times a day (TID) | ORAL | 0 refills | Status: DC | PRN

## 2016-05-26 NOTE — ED Notes (Signed)
Patient resting comfortably and states she feels better after the bolus.

## 2016-05-26 NOTE — ED Triage Notes (Signed)
Pt c/o dizzy spells since this morning. Sts she was at work and felt like she was going to pass out. Pt also c/o neck pain x a few months. Pt sts neck hurts with movement. Denies sore throat. Pt also c/o bug bites over body. Pt recently seen on 10/18 for the same. Pt A&Ox4 and ambulatory, NAD noted.

## 2016-05-26 NOTE — ED Provider Notes (Signed)
[facility]-EMERGENCY DEPT Provider Note   CSN: [id number] Arrival date & time: 05/26/16  0957     History   Chief Complaint Chief Complaint  Patient presents with  . Dizziness  . Neck Pain  . Insect Bite    HPI Pamela Cowan is a 40 y.o. female.  HPI Pt has been having dizzy spells starting yesterday.  They come and go.  Today the spell was constant and she saw purple spots and thought she would pass out.  The room was spinning.  It got worse when she moved.  No vomiting or diarrhea.  No chest pain or shortness of breath.  She felt feverish on Monday.  She had a sore throat but that has all resolved.  She has noticed some intermittent small areas of welps on her skin.  No definite bug bites but it could be.  No difficulty swallowing or breathing.    Past Medical History:  Diagnosis Date  . Diabetes mellitus without complication (HCC)    pt just put on metformin  . Ear infection     There are no active problems to display for this patient.   Past Surgical History:  Procedure Laterality Date  . arm surgery     right     OB History    No data available       Home Medications    Prior to Admission medications   Medication Sig Start Date End Date Taking? Authorizing Provider  acetaminophen (TYLENOL) 500 MG tablet Take 1,000 mg by mouth every 6 (six) hours as needed for moderate pain.   Yes Historical Provider, MD  diphenhydrAMINE (BENADRYL) 25 MG tablet Take 25-50 mg by mouth every 6 (six) hours as needed for allergies (insect bite).   Yes Historical Provider, MD  metFORMIN (GLUCOPHAGE-XR) 500 MG 24 hr tablet Take 500 mg by mouth daily with supper.   Yes Historical Provider, MD  phentermine 15 MG capsule Take 15 mg by mouth every morning.   Yes Historical Provider, MD  cephALEXin (KEFLEX) 500 MG capsule Take 500 mg by mouth 2 (two) times daily. For 7 days    Historical Provider, MD  meclizine (ANTIVERT) 25 MG tablet Take 1 tablet (25 mg total) by mouth 3 (three) times  daily as needed for dizziness. 05/26/16   Linwood DibblesJon Jeanae Whitmill, MD  naproxen (NAPROSYN) 500 MG tablet Take 1 tablet (500 mg total) by mouth 2 (two) times daily. 05/26/16   Linwood DibblesJon Angeleigh Chiasson, MD    Family History No family history on file.  Social History Social History  Substance Use Topics  . Smoking status: Never Smoker  . Smokeless tobacco: Not on file  . Alcohol use No     Allergies   Other and Shellfish allergy   Review of Systems Review of Systems  All other systems reviewed and are negative.    Physical Exam Updated Vital Signs BP 122/91 (BP Location: Right Arm)   Pulse 84   Temp 97.9 F (36.6 C) (Oral)   Resp 13   SpO2 100%   Physical Exam  Constitutional: She appears well-developed and well-nourished. No distress.  HENT:  Head: Normocephalic and atraumatic.  Right Ear: External ear normal.  Left Ear: External ear normal.  Eyes: Conjunctivae are normal. Right eye exhibits no discharge. Left eye exhibits no discharge. No scleral icterus.  Neck: Normal range of motion. Neck supple. No tracheal deviation present.  Cardiovascular: Normal rate, regular rhythm and intact distal pulses.   Pulmonary/Chest: Effort normal and breath sounds  normal. No stridor. No respiratory distress. She has no wheezes. She has no rales.  Abdominal: Soft. Bowel sounds are normal. She exhibits no distension. There is no tenderness. There is no rebound and no guarding.  Musculoskeletal: She exhibits no edema or tenderness.  Lymphadenopathy:    She has no cervical adenopathy.  Neurological: She is alert. She has normal strength. No cranial nerve deficit (no facial droop, extraocular movements intact, no slurred speech) or sensory deficit. She exhibits normal muscle tone. She displays no seizure activity. Coordination normal.  Skin: Skin is warm and dry. No rash noted.  Psychiatric: She has a normal mood and affect.  Nursing note and vitals reviewed.    ED Treatments / Results  Labs (all labs ordered  are listed, but only abnormal results are displayed) Labs Reviewed  CBC WITH DIFFERENTIAL/PLATELET - Abnormal; Notable for the following:       Result Value   Hemoglobin 11.2 (*)    HCT 32.5 (*)    MCV 77.2 (*)    All other components within normal limits  BASIC METABOLIC PANEL - Abnormal; Notable for the following:    Glucose, Bld 101 (*)    All other components within normal limits  I-STAT BETA HCG BLOOD, ED ([facility], [facility], [facility] ONLY)    EKG  EKG Interpretation  Date/Time:  Thursday May 26 2016 10:47:37 EDT Ventricular Rate:  90 PR Interval:    QRS Duration: 103 QT Interval:  352 QTC Calculation: 431 R Axis:   -3 Text Interpretation:  Sinus rhythm Low voltage, precordial leads RSR' in V1 or V2, right VCD or RVH No significant change since last tracing Confirmed by Thaddius Manes  MD-J, Mumin Denomme ([id number]) on 05/26/2016 11:07:58 AM      Procedures Procedures (including critical care time)  Medications Ordered in ED Medications  sodium chloride 0.9 % bolus 1,000 mL (0 mLs Intravenous Stopped 05/26/16 1223)     Initial Impression / Assessment and Plan / ED Course  I have reviewed the triage vital signs and the nursing notes.  Pertinent labs & imaging results that were available during my care of the patient were reviewed by me and considered in my medical decision making (see chart for details).  Clinical Course  Doubt stroke, dissection, tia.  Normal neuro exam.  Doubt SAH, meningitis.  Full range of motion of neck without difficulties.  No anemia or dehydration.  No sgins of infection. Possible viral illness with peripheral vertigo.  Will dc home with naprosyn and meclizine, follow up with PCP  Final Clinical Impressions(s) / ED Diagnoses   Final diagnoses:  Vertigo    New Prescriptions New Prescriptions   MECLIZINE (ANTIVERT) 25 MG TABLET    Take 1 tablet (25 mg total) by mouth 3 (three) times daily as needed for dizziness.   NAPROXEN (NAPROSYN) 500 MG TABLET    Take 1 tablet (500  mg total) by mouth 2 (two) times daily.     Linwood DibblesJon Ruel Dimmick, MD 05/26/16 1239

## 2017-05-19 ENCOUNTER — Encounter: Payer: Self-pay | Admitting: Physician Assistant

## 2017-05-19 ENCOUNTER — Ambulatory Visit (INDEPENDENT_AMBULATORY_CARE_PROVIDER_SITE_OTHER): Payer: Managed Care, Other (non HMO) | Admitting: Physician Assistant

## 2017-05-19 VITALS — BP 128/86 | HR 94 | Temp 98.5°F | Resp 18 | Ht 68.82 in | Wt 287.2 lb

## 2017-05-19 DIAGNOSIS — H1031 Unspecified acute conjunctivitis, right eye: Secondary | ICD-10-CM | POA: Diagnosis not present

## 2017-05-19 DIAGNOSIS — H547 Unspecified visual loss: Secondary | ICD-10-CM

## 2017-05-19 MED ORDER — ERYTHROMYCIN 5 MG/GM OP OINT: 1.0000 "application " | TOPICAL_OINTMENT | OPHTHALMIC | 0 refills | Status: DC

## 2017-05-19 MED ORDER — ERYTHROMYCIN 5 MG/GM OP OINT: 1.0000 "application " | TOPICAL_OINTMENT | OPHTHALMIC | 0 refills | Status: AC

## 2017-05-19 NOTE — Progress Notes (Deleted)
Subjective:    Pamela MachoBernice Cowan is a 41 y.o. female who presents for evaluation of {symptoms:16443} in {right/left/both eyes:19653}. She has noticed the above symptoms for {1-10:13787} {time; units:18646}. Onset was {pain onset:16558}. Patient denies {eye symptoms:16443}. There is a history of {eye hx:16612}.  {Common ambulatory SmartLinks:19316}  Review of Systems {ros - complete:30496}   Objective:    BP 128/86 (BP Location: Left Arm, Patient Position: Sitting, Cuff Size: Large)   Pulse 94   Temp 98.5 F (36.9 C) (Oral)   Resp 18   Ht 5' 8.82" (1.748 m)   Wt 287 lb 3.2 oz (130.3 kg)   LMP 05/12/2017 (Approximate)   SpO2 98%   BMI 42.64 kg/m       General: {gen appearance:16600}  Eyes:  {findings; exam eye:201::"conjunctivae/corneas clear. PERRL, EOM's intact. Fundi benign."}  Vision: {vision:16622}  Fluorescein:  {fluorescein:16712}     Visual Acuity Screening   Right eye Left eye Both eyes  Without correction: 20/50 20/70 20/40   With correction:       Assessment:    {eye dx:16571::"Acute conjunctivitis"}   Plan:    {eye tx plan:14155}

## 2017-05-19 NOTE — Progress Notes (Signed)
Pamela Cowan  MRN: [id number] DOB: 1975-10-07  Subjective:   Pamela Cowan is a 41 y.o. female who presents for evaluation of discharge and itching in the right eye. It is matted shut in the morning with thick white discharge. Notes her left eye is starting to feel a little itchy. She has noticed the above symptoms for 3 days.  Patient denies foreign body sensation, pain, photophobia and visual field deficit. Denies history of allergies, contact lens use, exposure to chemicals, other family members with similar symptoms and trauma. Has been around lots of children.  Has tried some OTC pink eye relief and visine with relief of redness. She is supposed to wear eyeglasses. Last eye appointment was last year. Got new glasses at that appointment but she does not use them. Notes she needs to go back.  Review of Systems  Constitutional: Negative for chills, diaphoresis and fever.  Gastrointestinal: Negative for nausea and vomiting.    There are no active problems to display for this patient.   Current Outpatient Prescriptions on File Prior to Visit  Medication Sig Dispense Refill  . metFORMIN (GLUCOPHAGE-XR) 500 MG 24 hr tablet Take 500 mg by mouth daily with supper.    . phentermine 15 MG capsule Take 15 mg by mouth every morning.    Marland Kitchen acetaminophen (TYLENOL) 500 MG tablet Take 1,000 mg by mouth every 6 (six) hours as needed for moderate pain.    . cephALEXin (KEFLEX) 500 MG capsule Take 500 mg by mouth 2 (two) times daily. For 7 days    . diphenhydrAMINE (BENADRYL) 25 MG tablet Take 25-50 mg by mouth every 6 (six) hours as needed for allergies (insect bite).    . meclizine (ANTIVERT) 25 MG tablet Take 1 tablet (25 mg total) by mouth 3 (three) times daily as needed for dizziness. (Patient not taking: Reported on 05/19/2017) 30 tablet 0  . naproxen (NAPROSYN) 500 MG tablet Take 1 tablet (500 mg total) by mouth 2 (two) times daily. (Patient not taking: Reported on 05/19/2017) 30 tablet 0   No  current facility-administered medications on file prior to visit.     Allergies  Allergen Reactions  . Other Anaphylaxis    ALL seafoods- fish, shellfish, etc..  . Shellfish Allergy Anaphylaxis     Objective:  BP 128/86 (BP Location: Left Arm, Patient Position: Sitting, Cuff Size: Large)   Pulse 94   Temp 98.5 F (36.9 C) (Oral)   Resp 18   Ht 5' 8.82" (1.748 m)   Wt 287 lb 3.2 oz (130.3 kg)   LMP 05/12/2017 (Approximate)   SpO2 98%   BMI 42.64 kg/m   Physical Exam  Constitutional: She is oriented to person, place, and time and well-developed, well-nourished, and in no distress.  HENT:  Head: Normocephalic and atraumatic. Head is without right periorbital erythema and without left periorbital erythema.  Eyes: Right eye visual fields normal and left eye visual fields normal. Pupils are equal, round, and reactive to light. EOM are normal. Right eye exhibits discharge (watery purulent discharge noted near medial canthus). Right eye exhibits no hordeolum. Left eye exhibits no discharge and no hordeolum. Right conjunctiva is injected (mildly). Left conjunctiva is not injected.  Fundoscopic exam:      The right eye shows no arteriolar narrowing, no AV nicking and no hemorrhage. The right eye shows red reflex.       The left eye shows no arteriolar narrowing, no AV nicking and no hemorrhage. The left eye shows red  reflex.  No pain with palpation of orbits bilaterally.  No swelling of the periorbital region noted.  Neck: Normal range of motion.  Pulmonary/Chest: Effort normal.  Neurological: She is alert and oriented to person, place, and time. Gait normal.  Skin: Skin is warm and dry.  Psychiatric: Affect normal.  Vitals reviewed.   Assessment and Plan :  1. Acute bacterial conjunctivitis of right eye History and physical exam findings consistent with bacterial conjunctivitis.  Will treat accordingly.  Patient encouraged to return to clinic if symptoms worsen, do not improve, or as  needed. - erythromycin oph this is baseline per patient.  Recommended she follow-up with her thalmic ointment; Place 1 application into the right eye every 4 (four) hours.  Dispense: 3.5 g; Refill: 0  2. Reduced visual acuity This is baseline for patient.  Recommended she follow-up with her eye doctor.  Also recommended that she start wearing her eyeglasses as prescribed.  Patient voices her understanding.  Benjiman CoreBrittany Deaja Rizo PA-C  Primary [facility] at [facility]  [facility] 05/19/2017 3:10 PM

## 2017-05-19 NOTE — Patient Instructions (Addendum)
  Your symptoms are consistent with bacterial conjunctivitis. I recommend using an antibiotic ointment to the eye as prescribed. Use for 7-10 days. Do not touch the tip of the applicator to the eye. Please return to clinic if symptoms do not improve with treatment or if you develop new concerning symptoms such as double vision, eye pain, pain with looking at light, nausea, or vomiting.     Bacterial Conjunctivitis Bacterial conjunctivitis is an infection of your conjunctiva. This is the clear membrane that covers the white part of your eye and the inner surface of your eyelid. This condition can make your eye:  Red or pink.  Itchy.  This condition is caused by bacteria. This condition spreads very easily from person to person (is contagious) and from one eye to the other eye. Follow these instructions at home: Medicines  Take or apply your antibiotic medicine as told by your doctor. Do not stop taking or applying the antibiotic even if you start to feel better.  Take or apply over-the-counter and prescription medicines only as told by your doctor.  Do not touch your eyelid with the eye drop bottle or the ointment tube. Managing discomfort  Wipe any fluid from your eye with a warm, wet washcloth or a cotton ball.  Place a cool, clean washcloth on your eye. Do this for 10-20 minutes, 3-4 times per day. General instructions  Do not wear contact lenses until the irritation is gone. Wear glasses until your doctor says it is okay to wear contacts.  Do not wear eye makeup until your symptoms are gone. Throw away any old makeup.  Change or wash your pillowcase every day.  Do not share towels or washcloths with anyone.  Wash your hands often with soap and water. Use paper towels to dry your hands.  Do not touch or rub your eyes.  Do not drive or use heavy machinery if your vision is blurry. Contact a doctor if:  You have a fever.  Your symptoms do not get better after 10 days. Get  help right away if:  You have a fever and your symptoms suddenly get worse.  You have very bad pain when you move your eye.  Your face: ? Hurts. ? Is red. ? Is swollen.  You have sudden loss of vision. This information is not intended to replace advice given to you by your health care provider. Make sure you discuss any questions you have with your health care provider. Document Released: 04/19/2008 Document Revised: 12/17/2015 Document Reviewed: 04/23/2015 Elsevier Interactive Patient Education  2018 [employer]  IF you received an x-ray today, you will receive an invoice from [facility]. Please contact [facility] at [phone] with questions or concerns regarding your invoice.   IF you received labwork today, you will receive an invoice from [facility]. Please contact [facility] at [phone] with questions or concerns regarding your invoice.   Our billing staff will not be able to assist you with questions regarding bills from these companies.  You will be contacted with the lab results as soon as they are available. The fastest way to get your results is to activate your My Chart account. Instructions are located on the last page of this paperwork. If you have not heard from us regarding the results in 2 weeks, please contact this office.

## 2017-08-16 ENCOUNTER — Emergency Department (HOSPITAL_COMMUNITY)
Admission: EM | Admit: 2017-08-16 | Discharge: 2017-08-16 | Disposition: A | Discharge status: Home / Self Care | Payer: Managed Care, Other (non HMO) | Attending: Emergency Medicine | Admitting: Emergency Medicine

## 2017-08-16 ENCOUNTER — Encounter (HOSPITAL_COMMUNITY): Payer: Self-pay

## 2017-08-16 ENCOUNTER — Other Ambulatory Visit: Payer: Self-pay

## 2017-08-16 DIAGNOSIS — R112 Nausea with vomiting, unspecified: Secondary | ICD-10-CM | POA: Insufficient documentation

## 2017-08-16 DIAGNOSIS — Z7984 Long term (current) use of oral hypoglycemic drugs: Secondary | ICD-10-CM | POA: Insufficient documentation

## 2017-08-16 DIAGNOSIS — E119 Type 2 diabetes mellitus without complications: Secondary | ICD-10-CM | POA: Diagnosis not present

## 2017-08-16 DIAGNOSIS — M62830 Muscle spasm of back: Secondary | ICD-10-CM | POA: Diagnosis not present

## 2017-08-16 DIAGNOSIS — M549 Dorsalgia, unspecified: Secondary | ICD-10-CM | POA: Diagnosis present

## 2017-08-16 LAB — URINALYSIS, ROUTINE W REFLEX MICROSCOPIC
BILIRUBIN URINE: NEGATIVE
Glucose, UA: NEGATIVE mg/dL
Hgb urine dipstick: NEGATIVE
Ketones, ur: NEGATIVE mg/dL
Nitrite: NEGATIVE
PH: 6 (ref 5.0–8.0)
PROTEIN: NEGATIVE mg/dL
Specific Gravity, Urine: 1.01 (ref 1.005–1.030)

## 2017-08-16 LAB — COMPREHENSIVE METABOLIC PANEL
ALBUMIN: 3.5 g/dL (ref 3.5–5.0)
ALK PHOS: 94 U/L (ref 38–126)
ALT: 11 U/L — ABNORMAL LOW (ref 14–54)
ANION GAP: 7 (ref 5–15)
AST: 16 U/L (ref 15–41)
BUN: 11 mg/dL (ref 6–20)
CHLORIDE: 108 mmol/L (ref 101–111)
CO2: 24 mmol/L (ref 22–32)
Calcium: 9.6 mg/dL (ref 8.9–10.3)
Creatinine, Ser: 0.74 mg/dL (ref 0.44–1.00)
GFR calc non Af Amer: >60 mL/min (ref >60)
GLUCOSE: 98 mg/dL (ref 65–99)
Potassium: 4.2 mmol/L (ref 3.5–5.1)
SODIUM: 139 mmol/L (ref 135–145)
Total Bilirubin: 0.4 mg/dL (ref 0.3–1.2)
Total Protein: 8.6 g/dL — ABNORMAL HIGH (ref 6.5–8.1)

## 2017-08-16 LAB — CBC
HEMATOCRIT: 32.4 % — AB (ref 36.0–46.0)
HEMOGLOBIN: 11 g/dL — AB (ref 12.0–15.0)
MCH: 26.6 pg (ref 26.0–34.0)
MCHC: 34 g/dL (ref 30.0–36.0)
MCV: 78.5 fL (ref 78.0–100.0)
Platelets: 289 10*3/uL (ref 150–400)
RBC: 4.13 MIL/uL (ref 3.87–5.11)
RDW: 13.2 % (ref 11.5–15.5)
WBC: 7.2 10*3/uL (ref 4.0–10.5)

## 2017-08-16 LAB — LIPASE, BLOOD: LIPASE: 25 U/L (ref 11–51)

## 2017-08-16 LAB — I-STAT BETA HCG BLOOD, ED (MC, WL, AP ONLY): I-stat hCG, quantitative: <5 m[IU]/mL (ref <5)

## 2017-08-16 MED ORDER — ONDANSETRON HCL 4 MG PO TABS: 4.0000 mg | ORAL_TABLET | Freq: Three times a day (TID) | ORAL | 0 refills | Status: DC | PRN

## 2017-08-16 MED ORDER — HYDROCODONE-ACETAMINOPHEN 5-325 MG PO TABS: 1.0000 | ORAL_TABLET | ORAL | 0 refills | Status: DC | PRN

## 2017-08-16 MED ORDER — METHOCARBAMOL 500 MG PO TABS: 500.0000 mg | ORAL_TABLET | Freq: Two times a day (BID) | ORAL | 0 refills | Status: DC

## 2017-08-16 NOTE — ED Notes (Signed)
Pt currently in the bathroom attempting to provide urine sample

## 2017-08-16 NOTE — ED Provider Notes (Signed)
[facility]   CSN: [id number] Arrival date & time: 08/16/17  1828     History   Chief Complaint Chief Complaint  Patient presents with  . Back Pain    R  . Emesis    HPI Pamela Cowan is a 42 y.o. female.  Patient presents with right sided mid-back pain for 3 days that became sharp, shooting pain through the night. She states the pain is worse with certain movements. There is no SOB and pain is no worse with the work of breathing. No cough or fever. The pain does not extend to the abdomen. No known specific injury but she reports recently starting a work out program in [employer]. Today she has experienced nausea with one episode of vomiting and states she feels this was related to pain. No diarrhea. No sick contacts.    The history is provided by the patient. No language interpreter was used.    Past Medical History:  Diagnosis Date  . Diabetes mellitus without complication (HCC)    pt just put on metformin  . Ear infection     There are no active problems to display for this patient.   Past Surgical History:  Procedure Laterality Date  . arm surgery     right   . FRACTURE SURGERY      OB History    No data available       Home Medications    Prior to Admission medications   Medication Sig Start Date End Date Taking? Authorizing Provider  acetaminophen (TYLENOL) 500 MG tablet Take 1,000 mg by mouth every 6 (six) hours as needed for moderate pain.    [provider]  cephALEXin (KEFLEX) 500 MG capsule Take 500 mg by mouth 2 (two) times daily. For 7 days    [provider]  diphenhydrAMINE (BENADRYL) 25 MG tablet Take 25-50 mg by mouth every 6 (six) hours as needed for allergies (insect bite).    [provider]  meclizine (ANTIVERT) 25 MG tablet Take 1 tablet (25 mg total) by mouth 3 (three) times daily as needed for dizziness. Patient not taking: Reported on 05/19/2017 05/26/16   Linwood DibblesKnapp,  Jon, MD  metFORMIN (GLUCOPHAGE-XR) 500 MG 24 hr tablet Take 500 mg by mouth daily with supper.    [provider]  naproxen (NAPROSYN) 500 MG tablet Take 1 tablet (500 mg total) by mouth 2 (two) times daily. Patient not taking: Reported on 05/19/2017 05/26/16   Linwood DibblesKnapp, Jon, MD  phentermine 15 MG capsule Take 15 mg by mouth every morning.    [provider]    Family History Family History  Problem Relation Age of Onset  . Diabetes Mother   . Hypertension Mother   . Hyperlipidemia Maternal Grandmother   . Hypertension Maternal Grandmother   . Diabetes Maternal Grandfather   . Mental illness Maternal Grandfather     Social History Social History   Tobacco Use  . Smoking status: Never Smoker  . Smokeless tobacco: Never Used  Substance Use Topics  . Alcohol use: No  . Drug use: No     Allergies   Other and Shellfish allergy   Review of Systems Review of Systems  Constitutional: Negative for chills and fever.  Respiratory: Negative.  Negative for cough and shortness of breath.   Cardiovascular: Negative.  Negative for chest pain.  Gastrointestinal: Positive for nausea and vomiting. Negative for abdominal pain and diarrhea.  Musculoskeletal: Positive for back pain.  See HPI.  Skin: Negative.  Negative for rash.  Neurological: Negative.  Negative for weakness.     Physical Exam Updated Vital Signs BP 138/82   Pulse (!) 106   Temp 98.1 F (36.7 C) (Oral)   Resp 18   SpO2 100%   Physical Exam  Constitutional: She appears well-developed and well-nourished. No distress.  Neck: Normal range of motion. Neck supple.  Cardiovascular: Normal rate and regular rhythm.  No murmur heard. Pulmonary/Chest: Effort normal and breath sounds normal. She has no wheezes. She has no rales. She exhibits no tenderness.  Abdominal: Soft. There is no tenderness.  Musculoskeletal: Normal range of motion.       Back:  FROM all extremities. Mild tenderness to focal  area of right mid-back. No palpable spasm.   Neurological: She is alert. She exhibits normal muscle tone. Coordination normal.  Skin: Skin is warm and dry. No rash noted.     ED Treatments / Results  Labs (all labs ordered are listed, but only abnormal results are displayed) Labs Reviewed  COMPREHENSIVE METABOLIC PANEL - Abnormal; Notable for the following components:      Result Value   Total Protein 8.6 (*)    ALT 11 (*)    All other components within normal limits  CBC - Abnormal; Notable for the following components:   Hemoglobin 11.0 (*)    HCT 32.4 (*)    All other components within normal limits  LIPASE, BLOOD  URINALYSIS, ROUTINE W REFLEX MICROSCOPIC  I-STAT BETA HCG BLOOD, ED ([facility], [facility], [facility] ONLY)   Results for orders placed or performed during the hospital encounter of 08/16/17  Lipase, blood  Result Value Ref Range   Lipase 25 11 - 51 U/L  Comprehensive metabolic panel  Result Value Ref Range   Sodium 139 135 - 145 mmol/L   Potassium 4.2 3.5 - 5.1 mmol/L   Chloride 108 101 - 111 mmol/L   CO2 24 22 - 32 mmol/L   Glucose, Bld 98 65 - 99 mg/dL   BUN 11 6 - 20 mg/dL   Creatinine, Ser 0.98 0.44 - 1.00 mg/dL   Calcium 9.6 8.9 - 11.9 mg/dL   Total Protein 8.6 (H) 6.5 - 8.1 g/dL   Albumin 3.5 3.5 - 5.0 g/dL   AST 16 15 - 41 U/L   ALT 11 (L) 14 - 54 U/L   Alkaline Phosphatase 94 38 - 126 U/L   Total Bilirubin 0.4 0.3 - 1.2 mg/dL   GFR calc non Af Amer >60 >60 mL/min   GFR calc Af Amer >60 >60 mL/min   Anion gap 7 5 - 15  CBC  Result Value Ref Range   WBC 7.2 4.0 - 10.5 K/uL   RBC 4.13 3.87 - 5.11 MIL/uL   Hemoglobin 11.0 (L) 12.0 - 15.0 g/dL   HCT 14.7 (L) 82.9 - 56.2 %   MCV 78.5 78.0 - 100.0 fL   MCH 26.6 26.0 - 34.0 pg   MCHC 34.0 30.0 - 36.0 g/dL   RDW 13.0 86.5 - 78.4 %   Platelets 289 150 - 400 K/uL  Urinalysis, Routine w reflex microscopic  Result Value Ref Range   Color, Urine YELLOW YELLOW   APPearance CLEAR CLEAR   Specific Gravity, Urine 1.010  1.005 - 1.030   pH 6.0 5.0 - 8.0   Glucose, UA NEGATIVE NEGATIVE mg/dL   Hgb urine dipstick NEGATIVE NEGATIVE   Bilirubin Urine NEGATIVE NEGATIVE   Ketones, ur NEGATIVE NEGATIVE mg/dL  Protein, ur NEGATIVE NEGATIVE mg/dL   Nitrite NEGATIVE NEGATIVE   Leukocytes, UA SMALL (A) NEGATIVE   RBC / HPF 0-5 0 - 5 RBC/hpf   WBC, UA 6-30 0 - 5 WBC/hpf   Bacteria, UA RARE (A) NONE SEEN   Squamous Epithelial / LPF 0-5 (A) NONE SEEN   Mucus PRESENT    Hyaline Casts, UA PRESENT   I-Stat beta hCG blood, ED  Result Value Ref Range   I-stat hCG, quantitative <5.0 <5 mIU/mL   Comment 3            EKG  EKG Interpretation None       Radiology No results found.  Procedures Procedures (including critical care time)  Medications Ordered in ED Medications - No data to display   Initial Impression / Assessment and Plan / ED Course  I have reviewed the triage vital signs and the nursing notes.  Pertinent labs & imaging results that were available during my care of the patient were reviewed by me and considered in my medical decision making (see chart for details).     Patient presents with soreness to back for several days, now with sharp, shooting pain c/w spasm. No respiratory symptoms, clear BS all fields. No abdominal pain. Nausea with vomiting when pain is most severe, felt likely related to back spasm.   She can be discharged home with Robaxin and #5 Norco. Will also provide prn Zofran if needed. Follow up with her doctor is encouraged if no better in 2-3 days.  Final Clinical Impressions(s) / ED Diagnoses   Final diagnoses:  None   1. Back spasm  ED Discharge Orders    None       Danne Harbor 08/16/17 2325    Mancel Bale, MD 08/16/17 2351

## 2017-08-16 NOTE — ED Notes (Signed)
Attempted lab draw x2, was unsuccessful 

## 2017-08-16 NOTE — ED Triage Notes (Signed)
Pt reports R sided back pain for the last several days. She states, "when I move a certain way, it catches and I almost fall out." She denies urinary symptoms. She states that she did experience some N/V today. She does not know if it is related to the back pain. A&Ox4.

## 2017-08-17 ENCOUNTER — Other Ambulatory Visit: Payer: Self-pay

## 2017-08-17 ENCOUNTER — Ambulatory Visit (INDEPENDENT_AMBULATORY_CARE_PROVIDER_SITE_OTHER): Payer: Managed Care, Other (non HMO)

## 2017-08-17 ENCOUNTER — Encounter: Payer: Self-pay | Admitting: Physician Assistant

## 2017-08-17 ENCOUNTER — Ambulatory Visit: Payer: Managed Care, Other (non HMO) | Admitting: Physician Assistant

## 2017-08-17 VITALS — BP 124/81 | HR 99 | Temp 98.5°F | Resp 18 | Ht 68.27 in | Wt 289.2 lb

## 2017-08-17 DIAGNOSIS — R82998 Other abnormal findings in urine: Secondary | ICD-10-CM

## 2017-08-17 DIAGNOSIS — R109 Unspecified abdominal pain: Secondary | ICD-10-CM | POA: Diagnosis not present

## 2017-08-17 DIAGNOSIS — M62838 Other muscle spasm: Secondary | ICD-10-CM | POA: Diagnosis not present

## 2017-08-17 DIAGNOSIS — M546 Pain in thoracic spine: Secondary | ICD-10-CM

## 2017-08-17 LAB — POCT URINALYSIS DIP (MANUAL ENTRY)
Bilirubin, UA: NEGATIVE
Glucose, UA: NEGATIVE mg/dL
Ketones, POC UA: NEGATIVE mg/dL
NITRITE UA: NEGATIVE
PROTEIN UA: NEGATIVE mg/dL
RBC UA: NEGATIVE
SPEC GRAV UA: 1.02 (ref 1.010–1.025)
UROBILINOGEN UA: 0.2 U/dL
pH, UA: 5.5 (ref 5.0–8.0)

## 2017-08-17 NOTE — Patient Instructions (Addendum)
Your x-ray was normal.  There were small leukocytes in your urine.  We are going to send off a urine culture to make sure this does not grow anything.  This is all likely musculoskeletal.  I do recommend he pick up the medication that was sent in from the ED.  I also recommend increasing water consumption.  I also recommend you apply heating pad and stretch.  Below is some information on stretching and medication. The worse thing you can do for low back pain is lie in bed all day or sit down all day. Use medications as needed.   You should avoid heavy lifting or strenuous repetitive activity to prevent recurrence of event. Experiment with both ice and heat and choose whichever feels best for you.  Use heat pad or ice pack, do not apply directly to skin, use barrier such as towel over the skin. Leave on for 15-20 minutes, 3-4 times a day.  Please perform exercises below. Stretches are to be performed for 2 sets, holding 10-15 seconds each. Recommended to perform this rehab twice daily within pain tolerance for 2 weeks.   FLEXION RANGE OF MOTION AND STRETCHING EXERCISES: STRETCH - Flexion, Single Knee to Chest   Lie on a firm bed or floor with both legs extended in front of you.  Keeping one leg in contact with the floor, bring your opposite knee to your chest. Hold your leg in place by either grabbing behind your thigh or at your knee.  Pull until you feel a gentle stretch in your lower back.   Slowly release your grasp and repeat the exercise with the opposite side.  STRETCH - Flexion, Double Knee to Chest   Lie on a firm bed or floor with both legs extended in front of you.  Keeping one leg in contact with the floor, bring your opposite knee to your chest.  Tense your stomach muscles to support your back and then lift your other knee to your chest. Hold your legs in place by either grabbing behind your thighs or at your knees.  Pull both knees toward your chest until you feel a gentle  stretch in your lower back.   Tense your stomach muscles and slowly return one leg at a time to the floor.  STRETCH - Low Trunk Rotation  Lie on a firm bed or floor. Keeping your legs in front of you, bend your knees so they are both pointed toward the ceiling and your feet are flat on the floor.  Extend your arms out to the side. This will stabilize your upper body by keeping your shoulders in contact with the floor.  Gently and slowly drop both knees together to one side until you feel a gentle stretch in your lower back.   Tense your stomach muscles to support your lower back as you bring your knees back to the starting position. Repeat the exercise to the other side.   EXTENSION RANGE OF MOTION AND FLEXIBILITY EXERCISES: STRETCH - Extension, Prone on Elbows   Lie on your stomach on the floor, a bed will be too soft. Place your palms about shoulder width apart and at the height of your head.  Place your elbows under your shoulders. If this is too painful, stack pillows under your chest.  Allow your body to relax so that your hips drop lower and make contact more completely with the floor.  Slowly return to lying flat on the floor.  RANGE OF MOTION - Extension, Prone  Press Ups  Lie on your stomach on the floor, a bed will be too soft. Place your palms about shoulder width apart and at the height of your head.  Keeping your back as relaxed as possible, slowly straighten your elbows while keeping your hips on the floor. You may adjust the placement of your hands to maximize your comfort. As you gain motion, your hands will come more underneath your shoulders.  Slowly return to lying flat on the floor.  RANGE OF MOTION- Quadruped, Neutral Spine   Assume a hands and knees position on a firm surface. Keep your hands under your shoulders and your knees under your hips. You may place padding under your knees for comfort.  Drop your head and point your tail bone toward the ground below  you. This will round out your lower back like an angry cat.    Slowly lift your head and release your tail bone so that your back sags into a large arch, like an old horse.  Repeat this until you feel limber in your lower back.  Now, find your "sweet spot." This will be the most comfortable position somewhere between the two previous positions. This is your neutral spine. Once you have found this position, tense your stomach muscles to support your lower back.  STRENGTHENING EXERCISES - Low Back Strain These exercises may help you when beginning to rehabilitate your injury. These exercises should be done near your "sweet spot." This is the neutral, low-back arch, somewhere between fully rounded and fully arched, that is your least painful position. When performed in this safe range of motion, these exercises can be used for people who have either a flexion or extension based injury. These exercises may resolve your symptoms with or without further involvement from your physician, physical therapist or athletic trainer. While completing these exercises, remember:   Muscles can gain both the endurance and the strength needed for everyday activities through controlled exercises.  Complete these exercises as instructed by your physician, physical therapist or athletic trainer. Increase the resistance and repetitions only as guided.  You may experience muscle soreness or fatigue, but the pain or discomfort you are trying to eliminate should never worsen during these exercises. If this pain does worsen, stop and make certain you are following the directions exactly. If the pain is still present after adjustments, discontinue the exercise until you can discuss the trouble with your caregiver.  STRENGTHENING - Deep Abdominals, Pelvic Tilt  Lie on a firm bed or floor. Keeping your legs in front of you, bend your knees so they are both pointed toward the ceiling and your feet are flat on the floor.  Tense  your lower abdominal muscles to press your lower back into the floor. This motion will rotate your pelvis so that your tail bone is scooping upwards rather than pointing at your feet or into the floor.  STRENGTHENING - Abdominals, Crunches   Lie on a firm bed or floor. Keeping your legs in front of you, bend your knees so they are both pointed toward the ceiling and your feet are flat on the floor. Cross your arms over your chest.  Slightly tip your chin down without bending your neck.  Tense your abdominals and slowly lift your trunk high enough to just clear your shoulder blades. Lifting higher can put excessive stress on the lower back and does not further strengthen your abdominal muscles.  Control your return to the starting position.  STRENGTHENING - Quadruped, Opposite  UE/LE Lift   Assume a hands and knees position on a firm surface. Keep your hands under your shoulders and your knees under your hips. You may place padding under your knees for comfort.  Find your neutral spine and gently tense your abdominal muscles so that you can maintain this position. Your shoulders and hips should form a rectangle that is parallel with the floor and is not twisted.  Keeping your trunk steady, lift your right hand no higher than your shoulder and then your left leg no higher than your hip. Make sure you are not holding your breath.   Continuing to keep your abdominal muscles tense and your back steady, slowly return to your starting position. Repeat with the opposite arm and leg.  STRENGTHENING - Lower Abdominals, Double Knee Lift  Lie on a firm bed or floor. Keeping your legs in front of you, bend your knees so they are both pointed toward the ceiling and your feet are flat on the floor.  Tense your abdominal muscles to brace your lower back and slowly lift both of your knees until they come over your hips. Be certain not to hold your breath.  POSTURE AND BODY MECHANICS CONSIDERATIONS - Low  Back Strain Keeping correct posture when sitting, standing or completing your activities will reduce the stress put on different body tissues, allowing injured tissues a chance to heal and limiting painful experiences. The following are general guidelines for improved posture. Your physician or physical therapist will provide you with any instructions specific to your needs. While reading these guidelines, remember:  The exercises prescribed by your provider will help you have the flexibility and strength to maintain correct postures.  The correct posture provides the best environment for your joints to work. All of your joints have less wear and tear when properly supported by a spine with good posture. This means you will experience a healthier, less painful body.  Correct posture must be practiced with all of your activities, especially prolonged sitting and standing. Correct posture is as important when doing repetitive low-stress activities (typing) as it is when doing a single heavy-load activity (lifting). RESTING POSITIONS Consider which positions are most painful for you when choosing a resting position. If you have pain with flexion-based activities (sitting, bending, stooping, squatting), choose a position that allows you to rest in a less flexed posture. You would want to avoid curling into a fetal position on your side. If your pain worsens with extension-based activities (prolonged standing, working overhead), avoid resting in an extended position such as sleeping on your stomach. Most people will find more comfort when they rest with their spine in a more neutral position, neither too rounded nor too arched. Lying on a non-sagging bed on your side with a pillow between your knees, or on your back with a pillow under your knees will often provide some relief. Keep in mind, being in any one position for a prolonged period of time, no matter how correct your posture, can still lead to  stiffness. PROPER SITTING POSTURE In order to minimize stress and discomfort on your spine, you must sit with correct posture. Sitting with good posture should be effortless for a healthy body. Returning to good posture is a gradual process. Many people can work toward this most comfortably by using various supports until they have the flexibility and strength to maintain this posture on their own. When sitting with proper posture, your ears will fall over your shoulders and your shoulders will  fall over your hips. You should use the back of the chair to support your upper back. Your lower back will be in a neutral position, just slightly arched. You may place a small pillow or folded towel at the base of your lower back for support.  When working at a desk, create an environment that supports good, upright posture. Without extra support, muscles tire, which leads to excessive strain on joints and other tissues. Keep these recommendations in mind: CHAIR:  A chair should be able to slide under your desk when your back makes contact with the back of the chair. This allows you to work closely.  The chair's height should allow your eyes to be level with the upper part of your monitor and your hands to be slightly lower than your elbows. BODY POSITION  Your feet should make contact with the floor. If this is not possible, use a foot rest.  Keep your ears over your shoulders. This will reduce stress on your neck and lower back. INCORRECT SITTING POSTURES  If you are feeling tired and unable to assume a healthy sitting posture, do not slouch or slump. This puts excessive strain on your back tissues, causing more damage and pain. Healthier options include:  Using more support, like a lumbar pillow.  Switching tasks to something that requires you to be upright or walking.  Talking a brief walk.  Lying down to rest in a neutral-spine position. PROLONGED STANDING WHILE SLIGHTLY LEANING FORWARD  When  completing a task that requires you to lean forward while standing in one place for a long time, place either foot up on a stationary 2-4 inch high object to help maintain the best posture. When both feet are on the ground, the lower back tends to lose its slight inward curve. If this curve flattens (or becomes too large), then the back and your other joints will experience too much stress, tire more quickly, and can cause pain. CORRECT STANDING POSTURES Proper standing posture should be assumed with all daily activities, even if they only take a few moments, like when brushing your teeth. As in sitting, your ears should fall over your shoulders and your shoulders should fall over your hips. You should keep a slight tension in your abdominal muscles to brace your spine. Your tailbone should point down to the ground, not behind your body, resulting in an over-extended swayback posture.  INCORRECT STANDING POSTURES  Common incorrect standing postures include a forward head, locked knees and/or an excessive swayback. WALKING Walk with an upright posture. Your ears, shoulders and hips should all line-up. PROLONGED ACTIVITY IN A FLEXED POSITION When completing a task that requires you to bend forward at your waist or lean over a low surface, try to find a way to stabilize 3 out of 4 of your limbs. You can place a hand or elbow on your thigh or rest a knee on the surface you are reaching across. This will provide you more stability so that your muscles do not fatigue as quickly. By keeping your knees relaxed, or slightly bent, you will also reduce stress across your lower back. CORRECT LIFTING TECHNIQUES DO :   Assume a wide stance. This will provide you more stability and the opportunity to get as close as possible to the object which you are lifting.  Tense your abdominals to brace your spine. Bend at the knees and hips. Keeping your back locked in a neutral-spine position, lift using your leg muscles.  Lift with  your legs, keeping your back straight.  Test the weight of unknown objects before attempting to lift them.  Try to keep your elbows locked down at your sides in order get the best strength from your shoulders when carrying an object.  Always ask for help when lifting heavy or awkward objects. INCORRECT LIFTING TECHNIQUES DO NOT:   Lock your knees when lifting, even if it is a small object.  Bend and twist. Pivot at your feet or move your feet when needing to change directions.  Assume that you can safely pick up even a paper clip without proper posture.      IF you received an x-ray today, you will receive an invoice from [facility]. Please contact [facility] at [phone] with questions or concerns regarding your invoice.   IF you received labwork today, you will receive an invoice from [facility]. Please contact [facility] at [phone] with questions or concerns regarding your invoice.   Our billing staff will not be able to assist you with questions regarding bills from these companies.  You will be contacted with the lab results as soon as they are available. The fastest way to get your results is to activate your My Chart account. Instructions are located on the last page of this paperwork. If you have not heard from [place] regarding the results in 2 weeks, please contact this office.

## 2017-08-17 NOTE — Progress Notes (Signed)
Sani Loiseau  MRN: [id number] DOB: 1976/02/11  Subjective:  Lavonn Maxcy is a 42 y.o. female seen in office today for a chief complaint of right sided back pain x 4 days. Described as sharp and shooting. Pain is aggravated by certain movements and is relieved by rest. The pain does not radiate. She works at [employer] and is constantly lifting heavy boxes. Notes she cannot recall a particular incident that provoked the pain. When she moves certain ways the pain does make her nauseous.  Denies acute injury, SOB, chest pain, cough, fever, abdominal pain, vomiting, hematuria, urinary frequency, urgency, numbness, tingling, and weakness. Denies saddle anesthesia and bladder/bowel incontinence. Of note, she was just evaluated by the ED last night for this pain. Notes she was treated for muscle spasm and given Rx for robaxin, norco, and zofran. She did not pick up the medication because she thought it could be a kidney stone so wanted to get reevaluated. No prior hx of kidney stones.  Review of Systems  Constitutional: Negative for chills, diaphoresis and fatigue.  Cardiovascular: Negative for palpitations.  Genitourinary: Negative for decreased urine volume.  Musculoskeletal: Negative for neck pain.  Neurological: Negative for dizziness and light-headedness.    There are no active problems to display for this patient.   Current Outpatient Medications on File Prior to Visit  Medication Sig Dispense Refill  . acetaminophen (TYLENOL) 500 MG tablet Take 1,000 mg by mouth every 6 (six) hours as needed for moderate pain.    Marland Kitchen HYDROcodone-acetaminophen (NORCO/VICODIN) 5-325 MG tablet Take 1 tablet by mouth every 4 (four) hours as needed. (Patient not taking: Reported on 08/17/2017) 5 tablet 0  . meclizine (ANTIVERT) 25 MG tablet Take 1 tablet (25 mg total) by mouth 3 (three) times daily as needed for dizziness. (Patient not taking: Reported on 05/19/2017) 30 tablet 0  . methocarbamol (ROBAXIN) 500  MG tablet Take 1 tablet (500 mg total) by mouth 2 (two) times daily. (Patient not taking: Reported on 08/17/2017) 20 tablet 0  . naproxen (NAPROSYN) 500 MG tablet Take 1 tablet (500 mg total) by mouth 2 (two) times daily. (Patient not taking: Reported on 05/19/2017) 30 tablet 0  . ondansetron (ZOFRAN) 4 MG tablet Take 1 tablet (4 mg total) by mouth every 8 (eight) hours as needed for nausea or vomiting. (Patient not taking: Reported on 08/17/2017) 12 tablet 0   No current facility-administered medications on file prior to visit.     Allergies  Allergen Reactions  . Other Anaphylaxis    ALL seafoods- fish, shellfish, etc..  . Shellfish Allergy Anaphylaxis   Social History   Socioeconomic History  . Marital status: Single    Spouse name: Not on file  . Number of children: 0  . Years of education: Not on file  . Highest education level: Not on file  Social Needs  . Financial resource strain: Not on file  . Food insecurity - worry: Not on file  . Food insecurity - inability: Not on file  . Transportation needs - medical: Not on file  . Transportation needs - non-medical: Not on file  Occupational History  . Not on file  Tobacco Use  . Smoking status: Never Smoker  . Smokeless tobacco: Never Used  Substance and Sexual Activity  . Alcohol use: No  . Drug use: No  . Sexual activity: Yes  Other Topics Concern  . Not on file  Social History Narrative  . Not on file  Objective:  BP 124/81 (BP Location: Left Arm, Patient Position: Sitting, Cuff Size: Large)   Pulse 99   Temp 98.5 F (36.9 C) (Oral)   Resp 18   Ht 5' 8.27" (1.734 m)   Wt 289 lb 3.2 oz (131.2 kg)   LMP 07/20/2017 (Approximate)   BMI 43.63 kg/m   Physical Exam  Constitutional: She is oriented to person, place, and time and well-developed, well-nourished, and in no distress.  Large body habitus noted.   HENT:  Head: Normocephalic and atraumatic.  Eyes: Conjunctivae are normal.  Neck: Normal range of  motion.  Pulmonary/Chest: Effort normal and breath sounds normal. She has no wheezes. She has no rhonchi. She has no rales.  Abdominal: Soft. Normal appearance and bowel sounds are normal. There is no tenderness. There is no CVA tenderness.  Musculoskeletal:       Cervical back: Normal.       Thoracic back: She exhibits decreased range of motion and tenderness. She exhibits no bony tenderness, no swelling and no spasm.       Lumbar back: Normal.       Back:  Neurological: She is alert and oriented to person, place, and time. She has a normal Straight Leg Raise Test. Gait normal.  Reflex Scores:      Patellar reflexes are 2+ on the right side and 2+ on the left side.      Achilles reflexes are 2+ on the right side and 2+ on the left side. Muscular strength of bilateral lower extremities 5/5.  Sensation of bilateral lower extremities intact.   Skin: Skin is warm and dry.  Psychiatric: Affect normal.  Vitals reviewed.    Results for orders placed or performed in visit on 08/17/17 (from the past 24 hour(s))  POCT urinalysis dipstick     Status: Abnormal   Collection Time: 08/17/17  5:26 PM  Result Value Ref Range   Color, UA yellow yellow   Clarity, UA cloudy (A) clear   Glucose, UA negative negative mg/dL   Bilirubin, UA negative negative   Ketones, POC UA negative negative mg/dL   Spec Grav, UA 1.610 9.604 - 1.025   Blood, UA negative negative   pH, UA 5.5 5.0 - 8.0   Protein Ur, POC negative negative mg/dL   Urobilinogen, UA 0.2 0.2 or 1.0 E.U./dL   Nitrite, UA Negative Negative   Leukocytes, UA Small (1+) (A) Negative   Dg Abd 1 View  Result Date: 08/17/2017 CLINICAL DATA:  Right-sided thoracic pain EXAM: ABDOMEN - 1 VIEW COMPARISON:  None. FINDINGS: The bowel gas pattern is normal. No radio-opaque calculi or other significant radiographic abnormality are seen. IMPRESSION: Normal abdominal radiograph. Electronically Signed   By: Deatra Robinson M.D.   On: 08/17/2017 18:00   I  personally reviewed ED notes from 08/16/17.  Assessment and Plan :  1. Acute right-sided thoracic back pain 2. Muscle spasm Hx and PE findings consistent with muscle spasm. No palpable spasm noted on exam but right sided thoracic musculature is tight upon palpation and pain is reproducible on exam. She is overall well appearing, no distress. Vitals stable. Pt reassured that no hematuria on UA and normal abdominal radiograph. Recommend she pick up the Rx she was given by the ED and use as prescribed. Also encouraged to use OTC ibuprofen as prescribed, heating pad, and perform daily stretches as tolerated. Avoid heavy lifting until pain resolves.  Given educational material on stretching. Advised to return to clinic if symptoms worsen,  do not improve, or as needed. - POCT urinalysis dipstick - DG Abd 1 View; Future 3. Leukocytes in urine - Urine Culture  Benjiman CoreBrittany Sri Clegg PA-C  [facility]  [facility] 08/17/2017 6:09 PM

## 2017-08-18 LAB — URINE CULTURE

## 2017-08-20 ENCOUNTER — Encounter: Payer: Self-pay | Admitting: Physician Assistant

## 2017-12-03 DIAGNOSIS — E119 Type 2 diabetes mellitus without complications: Secondary | ICD-10-CM | POA: Insufficient documentation

## 2017-12-03 DIAGNOSIS — R51 Headache: Secondary | ICD-10-CM | POA: Diagnosis present

## 2017-12-03 DIAGNOSIS — G43009 Migraine without aura, not intractable, without status migrainosus: Secondary | ICD-10-CM | POA: Insufficient documentation

## 2017-12-03 DIAGNOSIS — Z79899 Other long term (current) drug therapy: Secondary | ICD-10-CM | POA: Insufficient documentation

## 2017-12-04 ENCOUNTER — Emergency Department (HOSPITAL_COMMUNITY): Payer: Managed Care, Other (non HMO)

## 2017-12-04 ENCOUNTER — Emergency Department (HOSPITAL_COMMUNITY)
Admission: EM | Admit: 2017-12-04 | Discharge: 2017-12-04 | Disposition: A | Discharge status: Home / Self Care | Payer: Managed Care, Other (non HMO) | Attending: Emergency Medicine | Admitting: Emergency Medicine

## 2017-12-04 ENCOUNTER — Encounter (HOSPITAL_COMMUNITY): Payer: Self-pay | Admitting: Emergency Medicine

## 2017-12-04 ENCOUNTER — Other Ambulatory Visit: Payer: Self-pay

## 2017-12-04 DIAGNOSIS — G43009 Migraine without aura, not intractable, without status migrainosus: Secondary | ICD-10-CM

## 2017-12-04 LAB — CBG MONITORING, ED: Glucose-Capillary: 82 mg/dL (ref 65–99)

## 2017-12-04 MED ORDER — DEXAMETHASONE SODIUM PHOSPHATE 10 MG/ML IJ SOLN
10.0000 mg | Freq: Once | INTRAMUSCULAR | Status: AC
  Administered 2017-12-04: 10 mg via INTRAVENOUS
  Filled 2017-12-04: qty 1

## 2017-12-04 MED ORDER — SODIUM CHLORIDE 0.9 % IV BOLUS
1000.0000 mL | Freq: Once | INTRAVENOUS | Status: AC
  Administered 2017-12-04: 1000 mL via INTRAVENOUS

## 2017-12-04 MED ORDER — METOCLOPRAMIDE HCL 5 MG/ML IJ SOLN
10.0000 mg | Freq: Once | INTRAMUSCULAR | Status: AC
  Administered 2017-12-04: 10 mg via INTRAVENOUS
  Filled 2017-12-04: qty 2

## 2017-12-04 MED ORDER — DIPHENHYDRAMINE HCL 50 MG/ML IJ SOLN
25.0000 mg | Freq: Once | INTRAMUSCULAR | Status: AC
  Administered 2017-12-04: 25 mg via INTRAVENOUS
  Filled 2017-12-04: qty 1

## 2017-12-04 NOTE — ED Triage Notes (Signed)
Pt reports headache and chills that started yesterday and has been ongoing since then. Pt denies any nausea or vomiting.

## 2017-12-04 NOTE — ED Notes (Signed)
No respiratory or acute distress noted resting in bed with eyes closed call light in reach. 

## 2017-12-04 NOTE — Discharge Instructions (Addendum)
Go home and rest. Drink plenty of fluids. Recheck if you feel worse.

## 2017-12-04 NOTE — ED Provider Notes (Signed)
[facility]-EMERGENCY DEPT Provider Note   CSN: [id number] Arrival date & time: 12/03/17  2320  Time seen 05:55 AM   History   Chief Complaint Chief Complaint  Patient presents with  . Headache    HPI Pamela Cowan is a 42 y.o. female.  HPI patient states she has a history of headaches and her last bad headache was about 2 years ago.  She states yesterday, May 12 she started getting a headache after she was up for a while.  She states the headache is diffuse and now is going into the back of her neck.  She describes the pain as throbbing.  She denies nausea or vomiting but states she feels dizzy.  She has mild blurred vision.  She states she has photophobia without phonophobia.  She states coughing makes the headache worse, she states nothing makes it feel better.  She states she tried Mucinex and Tylenol earlier today without relief.  She states she also started having a cough yesterday however that is now gone.  She states she had undocumented fever and chills.  She denies sore throat, rhinorrhea, sneezing, or diarrhea, or chest pain.  She states his headache is similar to other headaches however normally Tylenol makes it go away.  She denies being around anybody else who is ill.  PCP Maxie Better, MD   Past Medical History:  Diagnosis Date  . Diabetes mellitus without complication (HCC)    pt just put on metformin  . Ear infection     There are no active problems to display for this patient.   Past Surgical History:  Procedure Laterality Date  . arm surgery     right   . FRACTURE SURGERY       OB History   None      Home Medications    Prior to Admission medications   Medication Sig Start Date End Date Taking? Authorizing Provider  acetaminophen (TYLENOL) 500 MG tablet Take 1,000 mg by mouth every 6 (six) hours as needed for moderate pain.   Yes [provider]  dextromethorphan-guaiFENesin (MUCINEX DM) 30-600 MG 12hr tablet  Take 1 tablet by mouth 2 (two) times daily as needed for cough.   Yes [provider]  HYDROcodone-acetaminophen (NORCO/VICODIN) 5-325 MG tablet Take 1 tablet by mouth every 4 (four) hours as needed. Patient not taking: Reported on 08/17/2017 08/16/17   Elpidio Anis, PA-C  methocarbamol (ROBAXIN) 500 MG tablet Take 1 tablet (500 mg total) by mouth 2 (two) times daily. Patient not taking: Reported on 08/17/2017 08/16/17   Elpidio Anis, PA-C  ondansetron (ZOFRAN) 4 MG tablet Take 1 tablet (4 mg total) by mouth every 8 (eight) hours as needed for nausea or vomiting. Patient not taking: Reported on 08/17/2017 08/16/17   Elpidio Anis, PA-C    Family History Family History  Problem Relation Age of Onset  . Diabetes Mother   . Hypertension Mother   . Hyperlipidemia Maternal Grandmother   . Hypertension Maternal Grandmother   . Diabetes Maternal Grandfather   . Mental illness Maternal Grandfather     Social History Social History   Tobacco Use  . Smoking status: Never Smoker  . Smokeless tobacco: Never Used  Substance Use Topics  . Alcohol use: No  . Drug use: No  employed   Allergies   Other and Shellfish allergy   Review of Systems Review of Systems  All other systems reviewed and are negative.    Physical Exam Updated Vital  Signs BP 131/87   Pulse 97   Temp 98.2 F (36.8 C) (Oral)   Resp 18   Ht 5' 9.5" (1.765 m)   Wt 124.7 kg (275 lb)   LMP 11/19/2017   SpO2 97%   BMI 40.03 kg/m   Vital signs normal    Physical Exam  Constitutional: She is oriented to person, place, and time. She appears well-developed and well-nourished.  Non-toxic appearance. She does not appear ill. No distress.  HENT:  Head: Normocephalic and atraumatic.  Right Ear: External ear normal.  Left Ear: External ear normal.  Nose: Nose normal. No mucosal edema or rhinorrhea.  Mouth/Throat: Oropharynx is clear and moist and mucous membranes are normal. No dental abscesses or uvula  swelling.  Eyes: Pupils are equal, round, and reactive to light. Conjunctivae and EOM are normal.  Neck: Normal range of motion and full passive range of motion without pain. Neck supple.  Patient has diffuse tenderness to palpation of her midline cervical spine in her paraspinous muscles.  However she has no pain when she turns her head from left to right or looks down, no nuchal rigidity.  She has minor discomfort if she looks up.  Cardiovascular: Normal rate, regular rhythm and normal heart sounds. Exam reveals no gallop and no friction rub.  No murmur heard. Pulmonary/Chest: Effort normal and breath sounds normal. No respiratory distress. She has no wheezes. She has no rhonchi. She has no rales. She exhibits no tenderness and no crepitus.  Abdominal: Soft. Normal appearance and bowel sounds are normal. She exhibits no distension. There is no tenderness. There is no rebound and no guarding.  Musculoskeletal: Normal range of motion. She exhibits no edema or tenderness.  Moves all extremities well.   Neurological: She is alert and oriented to person, place, and time. She has normal strength. No cranial nerve deficit.  Skin: Skin is warm, dry and intact. No rash noted. No erythema. No pallor.  Psychiatric: She has a normal mood and affect. Her speech is normal and behavior is normal. Her mood appears not anxious.  Nursing note and vitals reviewed.    ED Treatments / Results  Labs (all labs ordered are listed, but only abnormal results are displayed) Labs Reviewed  CBG MONITORING, ED    EKG None  Radiology Ct Head Wo Contrast  Result Date: 12/04/2017 CLINICAL DATA:  Headache, normal neurologic exam EXAM: CT HEAD WITHOUT CONTRAST TECHNIQUE: Contiguous axial images were obtained from the base of the skull through the vertex without intravenous contrast. COMPARISON:  None. FINDINGS: Brain: No evidence of acute infarction, hemorrhage, hydrocephalus, extra-axial collection or mass lesion/mass  effect. Vascular: Negative for hyperdense vessel Skull: Negative Sinuses/Orbits: Negative Other: None IMPRESSION: Negative CT head Electronically Signed   By: Marlan Palau M.D.   On: 12/04/2017 06:59    Procedures Procedures (including critical care time)  Medications Ordered in ED Medications  sodium chloride 0.9 % bolus 1,000 mL (has no administration in time range)  sodium chloride 0.9 % bolus 1,000 mL (1,000 mLs Intravenous New Bag/Given 12/04/17 1610)  metoCLOPramide (REGLAN) injection 10 mg (10 mg Intravenous Given 12/04/17 9604)  diphenhydrAMINE (BENADRYL) injection 25 mg (25 mg Intravenous Given 12/04/17 0634)  dexamethasone (DECADRON) injection 10 mg (10 mg Intravenous Given 12/04/17 5409)     Initial Impression / Assessment and Plan / ED Course  I have reviewed the triage vital signs and the nursing notes.  Pertinent labs & imaging results that were available during my care of the  patient were reviewed by me and considered in my medical decision making (see chart for details).     I suspect patient is having a migraine type headache, she does state however it is different from headaches she has had before so head CT was done.  She was given migraine cocktail.  Recheck at 7:10 AM she states her headache is almost gone, I gave her the results of her head CT.  We discussed that felt like her headache was a migraine.  07:33 AM patient states her headache is almost gone and she feels ready to be discharged home.  Final Clinical Impressions(s) / ED Diagnoses   Final diagnoses:  Migraine without aura and without status migrainosus, not intractable    ED Discharge Orders    None      Plan discharge  Devoria Albe, MD, Concha Pyo, MD 12/04/17 [phone]

## 2018-01-08 ENCOUNTER — Ambulatory Visit (INDEPENDENT_AMBULATORY_CARE_PROVIDER_SITE_OTHER): Payer: Managed Care, Other (non HMO) | Admitting: Physician Assistant

## 2018-01-08 ENCOUNTER — Other Ambulatory Visit: Payer: Self-pay

## 2018-01-08 ENCOUNTER — Encounter: Payer: Self-pay | Admitting: Physician Assistant

## 2018-01-08 ENCOUNTER — Telehealth: Payer: Self-pay | Admitting: Physician Assistant

## 2018-01-08 ENCOUNTER — Other Ambulatory Visit: Payer: Self-pay | Admitting: Physician Assistant

## 2018-01-08 VITALS — BP 129/87 | HR 83 | Temp 98.8°F | Resp 18 | Ht 67.48 in | Wt 292.6 lb

## 2018-01-08 DIAGNOSIS — Z13220 Encounter for screening for lipoid disorders: Secondary | ICD-10-CM

## 2018-01-08 DIAGNOSIS — R768 Other specified abnormal immunological findings in serum: Secondary | ICD-10-CM

## 2018-01-08 DIAGNOSIS — H547 Unspecified visual loss: Secondary | ICD-10-CM

## 2018-01-08 DIAGNOSIS — R350 Frequency of micturition: Secondary | ICD-10-CM | POA: Diagnosis not present

## 2018-01-08 DIAGNOSIS — Z23 Encounter for immunization: Secondary | ICD-10-CM

## 2018-01-08 DIAGNOSIS — Z0000 Encounter for general adult medical examination without abnormal findings: Secondary | ICD-10-CM | POA: Diagnosis not present

## 2018-01-08 DIAGNOSIS — Z130 Encounter for screening for diseases of the blood and blood-forming organs and certain disorders involving the immune mechanism: Secondary | ICD-10-CM

## 2018-01-08 DIAGNOSIS — E639 Nutritional deficiency, unspecified: Secondary | ICD-10-CM

## 2018-01-08 DIAGNOSIS — Z113 Encounter for screening for infections with a predominantly sexual mode of transmission: Secondary | ICD-10-CM | POA: Diagnosis not present

## 2018-01-08 DIAGNOSIS — Z13228 Encounter for screening for other metabolic disorders: Secondary | ICD-10-CM | POA: Diagnosis not present

## 2018-01-08 DIAGNOSIS — N926 Irregular menstruation, unspecified: Secondary | ICD-10-CM

## 2018-01-08 DIAGNOSIS — Z1329 Encounter for screening for other suspected endocrine disorder: Secondary | ICD-10-CM

## 2018-01-08 NOTE — Patient Instructions (Addendum)
Contact your gynecologist to get a refill of your birth control. If they do not respond with a refill, let me know and I will be happy to fill it for you. Work on diet and exercise!  Try to consume a diet that emphasizes intake of vegetables, fruits, and whole grains; including low-fat dairy, poultry, fish (like salmon and tuna), beans, olive oil, and nuts. Limit the amount of sweets and sugar in your diet. Try to start walking at least 10-20 minutes a day.   Make sure you go and get your eyes checked.  For allergies, try over the counter generic zyrtec or claritin or flonase.    Health Maintenance, Female Adopting a healthy lifestyle and getting preventive care can go a long way to promote health and wellness. Talk with your health care provider about what schedule of regular examinations is right for you. This is a good chance for you to check in with your provider about disease prevention and staying healthy. In between checkups, there are plenty of things you can do on your own. Experts have done a lot of research about which lifestyle changes and preventive measures are most likely to keep you healthy. Ask your health care provider for more information. Weight and diet Eat a healthy diet  Be sure to include plenty of vegetables, fruits, low-fat dairy products, and lean protein.  Do not eat a lot of foods high in solid fats, added sugars, or salt.  Get regular exercise. This is one of the most important things you can do for your health. ? Most adults should exercise for at least 150 minutes each week. The exercise should increase your heart rate and make you sweat (moderate-intensity exercise). ? Most adults should also do strengthening exercises at least twice a week. This is in addition to the moderate-intensity exercise.  Maintain a healthy weight  Body mass index (BMI) is a measurement that can be used to identify possible weight problems. It estimates body fat based on height and  weight. Your health care provider can help determine your BMI and help you achieve or maintain a healthy weight.  For females 28 years of age and older: ? A BMI below 18.5 is considered underweight. ? A BMI of 18.5 to 24.9 is normal. ? A BMI of 25 to 29.9 is considered overweight. ? A BMI of 30 and above is considered obese.  Watch levels of cholesterol and blood lipids  You should start having your blood tested for lipids and cholesterol at 41 years of age, then have this test every 5 years.  You may need to have your cholesterol levels checked more often if: ? Your lipid or cholesterol levels are high. ? You are older than 41 years of age. ? You are at high risk for heart disease.  Cancer screening Lung Cancer  Lung cancer screening is recommended for adults 60-88 years old who are at high risk for lung cancer because of a history of smoking.  A yearly low-dose CT scan of the lungs is recommended for people who: ? Currently smoke. ? Have quit within the past 15 years. ? Have at least a 30-pack-year history of smoking. A pack year is smoking an average of one pack of cigarettes a day for 1 year.  Yearly screening should continue until it has been 15 years since you quit.  Yearly screening should stop if you develop a health problem that would prevent you from having lung cancer treatment.  Breast Cancer  Practice breast self-awareness. This means understanding how your breasts normally appear and feel.  It also means doing regular breast self-exams. Let your health care provider know about any changes, no matter how small.  If you are in your 20s or 30s, you should have a clinical breast exam (CBE) by a health care provider every 1-3 years as part of a regular health exam.  If you are 47 or older, have a CBE every year. Also consider having a breast X-ray (mammogram) every year.  If you have a family history of breast cancer, talk to your health care provider about genetic  screening.  If you are at high risk for breast cancer, talk to your health care provider about having an MRI and a mammogram every year.  Breast cancer gene (BRCA) assessment is recommended for women who have family members with BRCA-related cancers. BRCA-related cancers include: ? Breast. ? Ovarian. ? Tubal. ? Peritoneal cancers.  Results of the assessment will determine the need for genetic counseling and BRCA1 and BRCA2 testing.  Cervical Cancer Your health care provider may recommend that you be screened regularly for cancer of the pelvic organs (ovaries, uterus, and vagina). This screening involves a pelvic examination, including checking for microscopic changes to the surface of your cervix (Pap test). You may be encouraged to have this screening done every 3 years, beginning at age 27.  For women ages 76-65, health care providers may recommend pelvic exams and Pap testing every 3 years, or they may recommend the Pap and pelvic exam, combined with testing for human papilloma virus (HPV), every 5 years. Some types of HPV increase your risk of cervical cancer. Testing for HPV may also be done on women of any age with unclear Pap test results.  Other health care providers may not recommend any screening for nonpregnant women who are considered low risk for pelvic cancer and who do not have symptoms. Ask your health care provider if a screening pelvic exam is right for you.  If you have had past treatment for cervical cancer or a condition that could lead to cancer, you need Pap tests and screening for cancer for at least 20 years after your treatment. If Pap tests have been discontinued, your risk factors (such as having a new sexual partner) need to be reassessed to determine if screening should resume. Some women have medical problems that increase the chance of getting cervical cancer. In these cases, your health care provider may recommend more frequent screening and Pap  tests.  Colorectal Cancer  This type of cancer can be detected and often prevented.  Routine colorectal cancer screening usually begins at 41 years of age and continues through 41 years of age.  Your health care provider may recommend screening at an earlier age if you have risk factors for colon cancer.  Your health care provider may also recommend using home test kits to check for hidden blood in the stool.  A small camera at the end of a tube can be used to examine your colon directly (sigmoidoscopy or colonoscopy). This is done to check for the earliest forms of colorectal cancer.  Routine screening usually begins at age 70.  Direct examination of the colon should be repeated every 5-10 years through 41 years of age. However, you may need to be screened more often if early forms of precancerous polyps or small growths are found.  Skin Cancer  Check your skin from head to toe regularly.  Tell your health care  provider about any new moles or changes in moles, especially if there is a change in a mole's shape or color.  Also tell your health care provider if you have a mole that is larger than the size of a pencil eraser.  Always use sunscreen. Apply sunscreen liberally and repeatedly throughout the day.  Protect yourself by wearing long sleeves, pants, a wide-brimmed hat, and sunglasses whenever you are outside.  Heart disease, diabetes, and high blood pressure  High blood pressure causes heart disease and increases the risk of stroke. High blood pressure is more likely to develop in: ? People who have blood pressure in the high end of the normal range (130-139/85-89 mm Hg). ? People who are overweight or obese. ? People who are African American.  If you are 17-73 years of age, have your blood pressure checked every 3-5 years. If you are 63 years of age or older, have your blood pressure checked every year. You should have your blood pressure measured twice-once when you are at  a hospital or clinic, and once when you are not at a hospital or clinic. Record the average of the two measurements. To check your blood pressure when you are not at a hospital or clinic, you can use: ? An automated blood pressure machine at a pharmacy. ? A home blood pressure monitor.  If you are between 62 years and 22 years old, ask your health care provider if you should take aspirin to prevent strokes.  Have regular diabetes screenings. This involves taking a blood sample to check your fasting blood sugar level. ? If you are at a normal weight and have a low risk for diabetes, have this test once every three years after 41 years of age. ? If you are overweight and have a high risk for diabetes, consider being tested at a younger age or more often. Preventing infection Hepatitis B  If you have a higher risk for hepatitis B, you should be screened for this virus. You are considered at high risk for hepatitis B if: ? You were born in a country where hepatitis B is common. Ask your health care provider which countries are considered high risk. ? Your parents were born in a high-risk country, and you have not been immunized against hepatitis B (hepatitis B vaccine). ? You have HIV or AIDS. ? You use needles to inject street drugs. ? You live with someone who has hepatitis B. ? You have had sex with someone who has hepatitis B. ? You get hemodialysis treatment. ? You take certain medicines for conditions, including cancer, organ transplantation, and autoimmune conditions.  Hepatitis C  Blood testing is recommended for: ? Everyone born from [birth year] through 1964. ? Anyone with known risk factors for hepatitis C.  Sexually transmitted infections (STIs)  You should be screened for sexually transmitted infections (STIs) including gonorrhea and chlamydia if: ? You are sexually active and are younger than 41 years of age. ? You are older than 41 years of age and your health care provider tells  you that you are at risk for this type of infection. ? Your sexual activity has changed since you were last screened and you are at an increased risk for chlamydia or gonorrhea. Ask your health care provider if you are at risk.  If you do not have HIV, but are at risk, it may be recommended that you take a prescription medicine daily to prevent HIV infection. This is called pre-exposure prophylaxis (PrEP). You  are considered at risk if: ? You are sexually active and do not regularly use condoms or know the HIV status of your partner(s). ? You take drugs by injection. ? You are sexually active with a partner who has HIV.  Talk with your health care provider about whether you are at high risk of being infected with HIV. If you choose to begin PrEP, you should first be tested for HIV. You should then be tested every 3 months for as long as you are taking PrEP. Pregnancy  If you are premenopausal and you may become pregnant, ask your health care provider about preconception counseling.  If you may become pregnant, take 400 to 800 micrograms (mcg) of folic acid every day.  If you want to prevent pregnancy, talk to your health care provider about birth control (contraception). Osteoporosis and menopause  Osteoporosis is a disease in which the bones lose minerals and strength with aging. This can result in serious bone fractures. Your risk for osteoporosis can be identified using a bone density scan.  If you are 28 years of age or older, or if you are at risk for osteoporosis and fractures, ask your health care provider if you should be screened.  Ask your health care provider whether you should take a calcium or vitamin D supplement to lower your risk for osteoporosis.  Menopause may have certain physical symptoms and risks.  Hormone replacement therapy may reduce some of these symptoms and risks. Talk to your health care provider about whether hormone replacement therapy is right for  you. Follow these instructions at home:  Schedule regular health, dental, and eye exams.  Stay current with your immunizations.  Do not use any tobacco products including cigarettes, chewing tobacco, or electronic cigarettes.  If you are pregnant, do not drink alcohol.  If you are breastfeeding, limit how much and how often you drink alcohol.  Limit alcohol intake to no more than 1 drink per day for nonpregnant women. One drink equals 12 ounces of beer, 5 ounces of wine, or 1 ounces of hard liquor.  Do not use street drugs.  Do not share needles.  Ask your health care provider for help if you need support or information about quitting drugs.  Tell your health care provider if you often feel depressed.  Tell your health care provider if you have ever been abused or do not feel safe at home. This information is not intended to replace advice given to you by your health care provider. Make sure you discuss any questions you have with your health care provider. Document Released: 01/24/2011 Document Revised: 12/17/2015 Document Reviewed: 04/14/2015 Elsevier Interactive Patient Education  2018 [employer].   IF you received an x-ray today, you will receive an invoice from [facility]. Please contact [facility] at [phone] with questions or concerns regarding your invoice.   IF you received labwork today, you will receive an invoice from [facility]. Please contact [facility] at [phone] with questions or concerns regarding your invoice.   Our billing staff will not be able to assist you with questions regarding bills from these companies.  You will be contacted with the lab results as soon as they are available. The fastest way to get your results is to activate your My Chart account. Instructions are located on the last page of this paperwork. If you have not heard from [place] regarding the results in 2 weeks, please contact this office.

## 2018-01-08 NOTE — Progress Notes (Signed)
Pamela Cowan  MRN: [id number] DOB: 1976-06-27  Subjective:  Pt is a 42 y.o. female who presents for annual physical exam. She is fasting today.   Exercise: one day per week, 60 minutes at a time.   Diet: Loves sweets, drinks sweet tea. Eats on the go a lot. Likes pastas and breads. Eats cheese. Drinks water. She is stress eater. Started going to counseling and she thinks that is helping, once a week right now.   Sleep: 4 hours on average.  BM: Every 2 days.  Menstrual cycles: Cycles have always been irregular unless she is on birth control. Has not taken birth control since March. Stopped to see what her cycles would do. They became irregular again. Followed by gyn for this.Is scheduling a follow up. She is sexually active with monogamous partner.    Last annual: Years ago Last dental exam: 2018, brushes twice daily Last vision exam:  2 years ago Last pap smear: 2018, followed by Dr. Garwin Brothers. Last mammogram: 2018  Vaccinations      Tetanus: >10 years ago       There are no active problems to display for this patient.   No current outpatient medications on file prior to visit.   No current facility-administered medications on file prior to visit.     Allergies  Allergen Reactions  . Other Anaphylaxis    ALL seafoods- fish, shellfish, etc..  . Shellfish Allergy Anaphylaxis    Social History   Socioeconomic History  . Marital status: Single    Spouse name: Not on file  . Number of children: 0  . Years of education: Not on file  . Highest education level: Some college, no degree  Occupational History  . Not on file  Social Needs  . Financial resource strain: Not hard at all  . Food insecurity:    Worry: Never true    Inability: Never true  . Transportation needs:    Medical: No    Non-medical: No  Tobacco Use  . Smoking status: Never Smoker  . Smokeless tobacco: Never Used  Substance and Sexual Activity  . Alcohol use: Yes    Alcohol/week: 0.6 oz   Types: 1 Glasses of wine per week  . Drug use: No  . Sexual activity: Yes  Lifestyle  . Physical activity:    Days per week: 1 day    Minutes per session: 60 min  . Stress: To some extent  Relationships  . Social connections:    Talks on phone: Twice a week    Gets together: Never    Attends religious service: More than 4 times per year    Active member of club or organization: Yes    Attends meetings of clubs or organizations: More than 4 times per year    Relationship status: Living with partner  Other Topics Concern  . Not on file  Social History Narrative   Pt is from [place] to [place] 5 years. Has a partner of 8 years. Her cousin lives with her.     Past Surgical History:  Procedure Laterality Date  . arm surgery     right   . FRACTURE SURGERY      Family History  Problem Relation Age of Onset  . Diabetes Mother   . Hypertension Mother   . Cancer Father   . Hyperlipidemia Maternal Grandmother   . Hypertension Maternal Grandmother   . Diabetes Maternal Grandfather   . Mental illness Maternal Grandfather   .  Dementia Paternal Grandmother     Review of Systems  Constitutional: Negative for activity change, appetite change, chills, diaphoresis, fatigue, fever and unexpected weight change.  HENT: Negative for congestion, dental problem, drooling, ear discharge, ear pain, facial swelling, hearing loss, mouth sores, nosebleeds, postnasal drip, rhinorrhea, sinus pressure, sinus pain, sneezing, sore throat, tinnitus, trouble swallowing and voice change.   Eyes: Negative for photophobia, pain, discharge, redness, itching and visual disturbance.  Respiratory: Negative for apnea, cough, choking, chest tightness, shortness of breath, wheezing and stridor.   Cardiovascular: Negative for chest pain, palpitations and leg swelling.  Gastrointestinal: Negative for abdominal distention, abdominal pain, anal bleeding, blood in stool, constipation, diarrhea, nausea, rectal  pain and vomiting.  Endocrine: Positive for polyuria. Negative for cold intolerance, heat intolerance, polydipsia and polyphagia.  Genitourinary: Positive for frequency (x 2 weeks), menstrual problem and urgency. Negative for decreased urine volume, difficulty urinating, dyspareunia, dysuria, enuresis, flank pain, genital sores, hematuria, pelvic pain, vaginal bleeding, vaginal discharge and vaginal pain.  Musculoskeletal: Negative for arthralgias, back pain, gait problem, joint swelling, myalgias, neck pain and neck stiffness.  Skin: Negative for color change, pallor, rash and wound.  Allergic/Immunologic: Negative for environmental allergies, food allergies and immunocompromised state.  Neurological: Negative for dizziness, tremors, seizures, syncope, facial asymmetry, speech difficulty, weakness, light-headedness, numbness and headaches.  Hematological: Negative for adenopathy. Does not bruise/bleed easily.  Psychiatric/Behavioral: Negative for agitation, behavioral problems, confusion, decreased concentration, dysphoric mood, hallucinations, self-injury, sleep disturbance and suicidal ideas. The patient is not nervous/anxious and is not hyperactive.     Objective:  BP 129/87 (BP Location: Left Arm, Patient Position: Sitting, Cuff Size: Large)   Pulse 83   Temp 98.8 F (37.1 C) (Oral)   Resp 18   Ht 5' 7.48" (1.714 m)   Wt 292 lb 9.6 oz (132.7 kg)   LMP 10/09/2017 (Approximate)   SpO2 98%   BMI 45.18 kg/m   Physical Exam  Constitutional: She is oriented to person, place, and time. She appears well-developed and well-nourished. No distress.  Large body habitus noted.   HENT:  Head: Normocephalic and atraumatic.  Right Ear: Hearing, tympanic membrane, external ear and ear canal normal.  Left Ear: Hearing, external ear and ear canal normal. Tympanic membrane is not erythematous and not bulging. A middle ear effusion is present.  Nose: Mucosal edema ( moderate b/l) present.    Mouth/Throat: Uvula is midline, oropharynx is clear and moist and mucous membranes are normal. Abnormal dentition (poor dentition noted). No oropharyngeal exudate.  Eyes: Pupils are equal, round, and reactive to light. Conjunctivae, EOM and lids are normal. No scleral icterus.  Neck: Trachea normal and normal range of motion. No thyroid mass and no thyromegaly present.  Cardiovascular: Normal rate, regular rhythm, normal heart sounds and intact distal pulses.  Pulmonary/Chest: Effort normal and breath sounds normal.  Abdominal: Soft. Normal appearance and bowel sounds are normal. There is no tenderness.  Lymphadenopathy:       Head (right side): No tonsillar, no preauricular, no posterior auricular and no occipital adenopathy present.       Head (left side): No tonsillar, no preauricular, no posterior auricular and no occipital adenopathy present.    She has no cervical adenopathy.       Right: No supraclavicular adenopathy present.       Left: No supraclavicular adenopathy present.  Neurological: She is alert and oriented to person, place, and time. She has normal strength and normal reflexes.  Skin: Skin is warm and dry.  Visual Acuity Screening   Right eye Left eye Both eyes  Without correction: '20/40 20/70 20/30 '  With correction:      No results found for this or any previous visit (from the past 24 hour(s)).  Assessment and Plan :  Discussed healthy lifestyle, diet, exercise, preventative care, vaccinations, and addressed patient's concerns. Plan for follow up in one year. Otherwise, plan for specific conditions below.  1. Annual physical exam Await lab results. Pt could not provide urine sample but will bring one in tomorrow.   2. Poor diet - Hemoglobin A1c  3. Urinary frequency - POCT urinalysis dipstick  4. Irregular menstrual cycle -Pt encouraged to follow up with gyn as they manage pt's birth control and pap smears. Recommend she restart birth control as long as preg  test is negative. She thinks she has some at home but will request refill from gyn if not. If gyn does not provide refill, encouraged pt to let us know what she takes and we can give refills.  - POCT urine pregnancy  5. Decreased visual acuity Recommended pt have updated eye exam performed with optometrist.  6. Screening cholesterol level - Lipid panel  7. Screening for metabolic disorder - SFS23+TRVU  8. Screening for thyroid disorder - TSH  9. Screening, anemia, deficiency, iron - CBC with Differential/Platelet  10. Screen for STD (sexually transmitted disease) - GC/Chlamydia Probe Amp - Hepatitis panel, acute - HIV antibody - RPR - Trichomonas vaginalis, RNA   Tenna Delaine, PA-C  [facility] 01/08/2018 6:23 PM

## 2018-01-08 NOTE — Telephone Encounter (Signed)
Pt. Has $40 balance on account that looks like they were filled as seeing a specialist when they saw us on two separate occasions.   Pt asked we look into it and sort it out.

## 2018-01-08 NOTE — Progress Notes (Signed)
Orders Placed This Encounter  Procedures  . POCT urinalysis dipstick    Standing Status:   Future    Standing Expiration Date:   01/15/2018  . POCT urine pregnancy    Standing Status:   Future    Standing Expiration Date:   01/15/2018

## 2018-01-09 ENCOUNTER — Other Ambulatory Visit: Payer: Self-pay | Admitting: Physician Assistant

## 2018-01-09 DIAGNOSIS — R768 Other specified abnormal immunological findings in serum: Secondary | ICD-10-CM

## 2018-01-09 LAB — POCT URINALYSIS DIP (MANUAL ENTRY)
BILIRUBIN UA: NEGATIVE mg/dL
Bilirubin, UA: NEGATIVE
Blood, UA: NEGATIVE
Glucose, UA: NEGATIVE mg/dL
Nitrite, UA: NEGATIVE
PH UA: 5.5 (ref 5.0–8.0)
PROTEIN UA: NEGATIVE mg/dL
SPEC GRAV UA: 1.02 (ref 1.010–1.025)
UROBILINOGEN UA: 0.2 U/dL

## 2018-01-09 LAB — CMP14+EGFR
ALK PHOS: 95 IU/L (ref 39–117)
ALT: 12 IU/L (ref 0–32)
AST: 11 IU/L (ref 0–40)
Albumin/Globulin Ratio: 1.1 — ABNORMAL LOW (ref 1.2–2.2)
Albumin: 4 g/dL (ref 3.5–5.5)
BUN / CREAT RATIO: 7 — AB (ref 9–23)
BUN: 7 mg/dL (ref 6–24)
Bilirubin Total: 0.4 mg/dL (ref 0.0–1.2)
CO2: 25 mmol/L (ref 20–29)
CREATININE: 0.99 mg/dL (ref 0.57–1.00)
Calcium: 9.5 mg/dL (ref 8.7–10.2)
Chloride: 102 mmol/L (ref 96–106)
GFR calc non Af Amer: 72 mL/min/{1.73_m2} (ref >59)
GFR, EST AFRICAN AMERICAN: 82 mL/min/{1.73_m2} (ref >59)
GLOBULIN, TOTAL: 3.7 g/dL (ref 1.5–4.5)
Glucose: 83 mg/dL (ref 65–99)
Potassium: 4.1 mmol/L (ref 3.5–5.2)
SODIUM: 140 mmol/L (ref 134–144)
TOTAL PROTEIN: 7.7 g/dL (ref 6.0–8.5)

## 2018-01-09 LAB — CBC WITH DIFFERENTIAL/PLATELET
BASOS: 1 %
Basophils Absolute: 0 10*3/uL (ref 0.0–0.2)
EOS (ABSOLUTE): 0.2 10*3/uL (ref 0.0–0.4)
EOS: 2 %
HEMATOCRIT: 32.8 % — AB (ref 34.0–46.6)
HEMOGLOBIN: 11 g/dL — AB (ref 11.1–15.9)
Immature Grans (Abs): 0 10*3/uL (ref 0.0–0.1)
Immature Granulocytes: 0 %
LYMPHS ABS: 4.6 10*3/uL — AB (ref 0.7–3.1)
Lymphs: 55 %
MCH: 25.9 pg — AB (ref 26.6–33.0)
MCHC: 33.5 g/dL (ref 31.5–35.7)
MCV: 77 fL — ABNORMAL LOW (ref 79–97)
MONOCYTES: 5 %
MONOS ABS: 0.4 10*3/uL (ref 0.1–0.9)
NEUTROS ABS: 3 10*3/uL (ref 1.4–7.0)
Neutrophils: 37 %
Platelets: 352 10*3/uL (ref 150–450)
RBC: 4.24 x10E6/uL (ref 3.77–5.28)
RDW: 15.1 % (ref 12.3–15.4)
WBC: 8.3 10*3/uL (ref 3.4–10.8)

## 2018-01-09 LAB — LIPID PANEL
CHOL/HDL RATIO: 3.8 ratio (ref 0.0–4.4)
Cholesterol, Total: 197 mg/dL (ref 100–199)
HDL: 52 mg/dL (ref >39)
LDL CALC: 118 mg/dL — AB (ref 0–99)
Triglycerides: 133 mg/dL (ref 0–149)
VLDL Cholesterol Cal: 27 mg/dL (ref 5–40)

## 2018-01-09 LAB — HEPATITIS PANEL, ACUTE
HEP A IGM: NEGATIVE
HEP C VIRUS AB: 6.6 {s_co_ratio} — AB (ref 0.0–0.9)
Hep B C IgM: NEGATIVE
Hepatitis B Surface Ag: NEGATIVE

## 2018-01-09 LAB — POCT URINE PREGNANCY: PREG TEST UR: NEGATIVE

## 2018-01-09 LAB — TSH: TSH: 1.64 u[IU]/mL (ref 0.450–4.500)

## 2018-01-09 LAB — HEMOGLOBIN A1C
ESTIMATED AVERAGE GLUCOSE: 114 mg/dL
Hgb A1c MFr Bld: 5.6 % (ref 4.8–5.6)

## 2018-01-09 LAB — RPR: RPR Ser Ql: NONREACTIVE

## 2018-01-09 LAB — HIV ANTIBODY (ROUTINE TESTING W REFLEX): HIV Screen 4th Generation wRfx: NONREACTIVE

## 2018-01-09 NOTE — Progress Notes (Signed)
Orders Placed This Encounter  Procedures  . HCV RNA quant    Standing Status:   Future    Standing Expiration Date:   01/16/2018

## 2018-01-09 NOTE — Addendum Note (Signed)
Addended by: Geoffery SpruceNEELY, Romelle Reiley M on: 01/09/2018 08:47 AM   Modules accepted: Orders

## 2018-01-09 NOTE — Addendum Note (Signed)
Addended by: Geoffery SpruceNEELY, Dashanna Kinnamon M on: 01/09/2018 03:33 PM   Modules accepted: Orders

## 2018-01-10 LAB — HCV RNA QUANT: Hepatitis C Quantitation: NOT DETECTED IU/mL

## 2018-01-11 LAB — GC/CHLAMYDIA PROBE AMP
CHLAMYDIA, DNA PROBE: NEGATIVE
NEISSERIA GONORRHOEAE BY PCR: NEGATIVE

## 2018-01-11 LAB — TRICHOMONAS VAGINALIS, PROBE AMP: Trich vag by NAA: POSITIVE — AB

## 2018-01-12 ENCOUNTER — Other Ambulatory Visit: Payer: Self-pay | Admitting: Physician Assistant

## 2018-01-12 ENCOUNTER — Telehealth: Payer: Self-pay | Admitting: Physician Assistant

## 2018-01-12 DIAGNOSIS — R768 Other specified abnormal immunological findings in serum: Secondary | ICD-10-CM

## 2018-01-12 MED ORDER — METRONIDAZOLE 500 MG PO TABS: 2000.0000 mg | ORAL_TABLET | Freq: Once | ORAL | 0 refills | Status: AC

## 2018-01-12 NOTE — Telephone Encounter (Signed)
Release labs please

## 2018-01-12 NOTE — Telephone Encounter (Signed)
Copied from CRM [phone]. Topic: Inquiry >> Jan 11, 2018  3:48 PM Windy KalataMichael, Taylor L, NT wrote: Reason for CRM: patient would like lab results from 01/08/18.

## 2018-01-12 NOTE — Progress Notes (Signed)
Meds ordered this encounter  Medications  . metroNIDAZOLE (FLAGYL) 500 MG tablet    Sig: Take 4 tablets (2,000 mg total) by mouth once for 1 dose.    Dispense:  4 tablet    Refill:  0    Order Specific Question:   Supervising Provider    Answer:   Ethelda ChickSMITH, KRISTI M [[id number]]   Orders Placed This Encounter  Procedures  . Ambulatory referral to Infectious Disease    Referral Priority:   Routine    Referral Type:   Consultation    Referral Reason:   Specialty Services Required    Requested Specialty:   Infectious Diseases    Number of Visits Requested:   1

## 2018-02-12 ENCOUNTER — Encounter: Payer: Managed Care, Other (non HMO) | Admitting: Internal Medicine

## 2018-02-27 ENCOUNTER — Encounter: Payer: Managed Care, Other (non HMO) | Admitting: Family

## 2018-03-06 ENCOUNTER — Encounter: Payer: Self-pay | Admitting: Family

## 2018-03-06 ENCOUNTER — Ambulatory Visit (INDEPENDENT_AMBULATORY_CARE_PROVIDER_SITE_OTHER): Payer: Managed Care, Other (non HMO) | Admitting: Family

## 2018-03-06 VITALS — BP 128/86 | HR 88 | Temp 98.1°F | Ht 70.0 in | Wt 295.0 lb

## 2018-03-06 DIAGNOSIS — R768 Other specified abnormal immunological findings in serum: Secondary | ICD-10-CM

## 2018-03-06 NOTE — Patient Instructions (Signed)
Nice to meet you.   We will check your blood work today.  No treatment is currently needed pending your blood results.  Limit acetaminophen (Tylenol) usage to no more than 2 grams (2,000 mg) per day.  Avoid alcohol.  Do not share toothbrushes or razors.  Practice safe sex to protect against transmission as well as sexually transmitted disease.    Hepatitis C Hepatitis C is a viral infection of the liver. It can lead to scarring of the liver (cirrhosis), liver failure, or liver cancer. Hepatitis C may go undetected for months or years because people with the infection may not have symptoms, or they may have only mild symptoms. What are the causes? This condition is caused by the hepatitis C virus (HCV). The virus can spread from person to person (is contagious) through:  Blood.  Childbirth. A woman who has hepatitis C can pass it to her baby during birth.  Bodily fluids, such as breast milk, tears, semen, vaginal fluids, and saliva.  Blood transfusions or organ transplants done in the [place] before 1992.  What increases the risk? The following factors may make you more likely to develop this condition:  Having contact with unclean (contaminated) needles or syringes. This may result from: ? Acupuncture. ? Tattoing. ? Body piercing. ? Injecting drugs.  Having unprotected sex with someone who is infected.  Needing treatment to filter your blood (kidney dialysis).  Having HIV (human immunodeficiency virus) or AIDS (acquired immunodeficiency syndrome).  Working in a job that involves contact with blood or bodily fluids, such as health care.  What are the signs or symptoms? Symptoms of this condition include:  Fatigue.  Loss of appetite.  Nausea.  Vomiting.  Abdominal pain.  Dark yellow urine.  Yellowish skin and eyes (jaundice).  Itchy skin.  Clay-colored bowel movements.  Joint pain.  Bleeding and bruising easily.  Fluid building up in your  stomach (ascites).  In some cases, you may not have any symptoms. How is this diagnosed? This condition is diagnosed with:  Blood tests.  Other tests to check how well your liver is functioning. They may include: ? Magnetic resonance elastography (MRE). This imaging test uses MRIs and sound waves to measure liver stiffness. ? Transient elastography. This imaging test uses ultrasounds to measure liver stiffness. ? Liver biopsy. This test requires taking a small tissue sample from your liver to examine it under a microscope.  How is this treated? Your health care provider may perform noninvasive tests or a liver biopsy to help decide the best course of treatment. Treatment may include:  Antiviral medicines and other medicines.  Follow-up treatments every 6-12 months for infections or other liver conditions.  Receiving a donated liver (liver transplant).  Follow these instructions at home: Medicines  Take over-the-counter and prescription medicines only as told by your health care provider.  Take your antiviral medicine as told by your health care provider. Do not stop taking the antiviral even if you start to feel better.  Do not take any medicines unless approved by your health care provider, including over-the-counter medicines and birth control pills. Activity  Rest as needed.  Do not have sex unless approved by your health care provider.  Ask your health care provider when you may return to school or work. Eating and drinking  Eat a balanced diet with plenty of fruits and vegetables, whole grains, and lowfat (lean) meats or non-meat proteins (such as beans or tofu).  Drink enough fluids to keep your urine  clear or pale yellow.  Do not drink alcohol. General instructions  Do not share toothbrushes, nail clippers, or razors.  Wash your hands frequently with soap and water. If soap and water are not available, use hand sanitizer.  Cover any cuts or open sores on your  skin to prevent spreading the virus.  Keep all follow-up visits as told by your health care provider. This is important. You may need follow-up visits every 6-12 months. How is this prevented? There is no vaccine for hepatitis C. The only way to prevent the disease is to reduce the risk of exposure to the virus. Make sure you:  Wash your hands frequently with soap and water. If soap and water are not available, use hand sanitizer.  Do not share needles or syringes.  Practice safe sex and use condoms.  Avoid handling blood or bodily fluids without gloves or other protection.  Avoid getting tattoos or piercings in shops or other locations that are not clean.  Contact a health care provider if:  You have a fever.  You develop abdominal pain.  You pass dark urine.  You pass clay-colored stools.  You develop joint pain. Get help right away if:  You have increasing fatigue or weakness.  You lose your appetite.  You cannot eat or drink without vomiting.  You develop jaundice or your jaundice gets worse.  You bruise or bleed easily. Summary  Hepatitis C is a viral infection of the liver. It can lead to scarring of the liver (cirrhosis), liver failure, or liver cancer.  The hepatitis C virus (HCV) causes this condition. The virus can pass from person to person (is contagious).  You should not take any medicines unless approved by your health care provider. This includes over-the-counter medicines and birth control pills. This information is not intended to replace advice given to you by your health care provider. Make sure you discuss any questions you have with your health care provider. Document Released: 07/08/2000 Document Revised: 08/16/2016 Document Reviewed: 08/16/2016 Elsevier Interactive Patient Education  [employer]2018 Elsevier Inc[employer]

## 2018-03-06 NOTE — Progress Notes (Signed)
Subjective:    Patient ID: Pamela Cowan, female    DOB: 14-Oct-1975, 42 y.o.   MRN: [id number]  Chief Complaint  Patient presents with  . New Patient (Initial Visit)    HPI:  Pamela Cowan is a 42 y.o. female who presents today for initial office visit for evaluation of Hepatitis C positive antibody test.   Pamela Cowan was recently evaluated in her primary care office for preventive maintenance and an acute hepatitis panel was ordered as part of screening for sexually transmitted disease. The result of the testing showed a positive Hepatitis C antibody. A corresponding viral load was undetectable. She was referred to ID for confirmation.   Pamela Cowan has risk factors for acquiring Hepatitis C including having multiple tattoos. Denies IVDU, blood transfusion, sharing of razors/toothbrushes, or sexual intercourse with anyone that has Hepatitis C that she is aware of. Her father did have cirrhosis due to alcoholism. She has never received any treatment for this. She is currently asymptomatic.    Allergies  Allergen Reactions  . Other Anaphylaxis    ALL seafoods- fish, shellfish, etc..  . Shellfish Allergy Anaphylaxis      No outpatient medications prior to visit.   No facility-administered medications prior to visit.      Past Medical History:  Diagnosis Date  . Diabetes mellitus without complication (HCC)    pt just put on metformin  . Ear infection       Past Surgical History:  Procedure Laterality Date  . arm surgery     right   . FRACTURE SURGERY        Family History  Problem Relation Age of Onset  . Diabetes Mother   . Hypertension Mother   . Cancer Father   . Hyperlipidemia Maternal Grandmother   . Hypertension Maternal Grandmother   . Diabetes Maternal Grandfather   . Mental illness Maternal Grandfather   . Dementia Paternal Grandmother       Social History   Socioeconomic History  . Marital status: Single    Spouse name: Not on file  . Number  of children: 0  . Years of education: Not on file  . Highest education level: Some college, no degree  Occupational History  . Not on file  Social Needs  . Financial resource strain: Not hard at all  . Food insecurity:    Worry: Never true    Inability: Never true  . Transportation needs:    Medical: No    Non-medical: No  Tobacco Use  . Smoking status: Never Smoker  . Smokeless tobacco: Never Used  Substance and Sexual Activity  . Alcohol use: Not Currently    Alcohol/week: 1.0 standard drinks    Types: 1 Glasses of wine per week  . Drug use: Not Currently  . Sexual activity: Yes  Lifestyle  . Physical activity:    Days per week: 1 day    Minutes per session: 60 min  . Stress: To some extent  Relationships  . Social connections:    Talks on phone: Twice a week    Gets together: Never    Attends religious service: More than 4 times per year    Active member of club or organization: Yes    Attends meetings of clubs or organizations: More than 4 times per year    Relationship status: Living with partner  . Intimate partner violence:    Fear of current or ex partner: No    Emotionally abused: No  Physically abused: No    Forced sexual activity: No  Other Topics Concern  . Not on file  Social History Narrative   Pt is from [place]. Moved to [place] 5 years. Has a partner of 8 years. Her cousin lives with her.      Review of Systems  Constitutional: Negative for chills, fatigue, fever and unexpected weight change.  Respiratory: Negative for cough, chest tightness, shortness of breath and wheezing.   Cardiovascular: Negative for chest pain and leg swelling.  Gastrointestinal: Negative for abdominal distention, constipation, diarrhea, nausea and vomiting.  Neurological: Negative for dizziness, weakness, light-headedness and headaches.  Hematological: Does not bruise/bleed easily.       Objective:    BP 128/86   Pulse 88   Temp 98.1 F (36.7 C)   Ht 5'  10" (1.778 m)   Wt 295 lb (133.8 kg)   BMI 42.33 kg/m  Nursing note and vital signs reviewed.  Physical Exam  Constitutional: She is oriented to person, place, and time. She appears well-developed. No distress.  Obese female seated in the chair; pleasant.   Cardiovascular: Normal rate, regular rhythm, normal heart sounds and intact distal pulses. Exam reveals no gallop and no friction rub.  No murmur heard. Pulmonary/Chest: Effort normal and breath sounds normal. No respiratory distress. She has no wheezes. She has no rales. She exhibits no tenderness.  Abdominal: Soft. Bowel sounds are normal. She exhibits no distension, no ascites and no mass. There is no hepatosplenomegaly. There is no tenderness. There is no rebound and no guarding.  Neurological: She is alert and oriented to person, place, and time.  Skin: Skin is warm and dry.  Psychiatric: She has a normal mood and affect. Her behavior is normal.        Assessment & Plan:   Problem List Items Addressed This Visit      Other   Hepatitis C antibody test positive - Primary    Pamela Cowan has a positive Hepatitis C antibody test with viral load that appears to be undetectable. We discussed the transmission, progression, risks if left untreated and treatment options. I will recheck her viral load today. If absent, no additional treatment or follow up is needed. If positive, will discuss treatment options and additional blood work.       Relevant Orders   Hepatitis C RNA quantitative       Pamela Cowan Pamela Cowan does not currently have medications on file.    Follow-up: As needed pending blood work.    Marcos EkeGreg Kirandeep Fariss, MSN, FNP-C Nurse Practitioner [facility] Office phone: [phone] Pager: [phone] [facility] Main number: [phone]

## 2018-03-06 NOTE — Assessment & Plan Note (Addendum)
Ms. Pamela Cowan has a positive Hepatitis C antibody test with viral load that appears to be undetectable. We discussed the transmission, progression, risks if left untreated and treatment options. I will recheck her viral load today. If absent, no additional treatment or follow up is needed. If positive, will discuss treatment options and additional blood work.

## 2018-03-08 LAB — HEPATITIS C RNA QUANTITATIVE
HCV QUANT LOG: NOT DETECTED {Log_IU}/mL
HCV RNA, PCR, QN: <15 IU/mL

## 2018-03-12 ENCOUNTER — Telehealth: Payer: Self-pay | Admitting: Behavioral Health

## 2018-03-12 NOTE — Telephone Encounter (Signed)
Called patient, verified identity.  Informed her per Marcos EkeGreg Calone NP that her Hepatitis C viral load is non detectable and no treatment is necessary.  Patient verbalized understanding. Angeline SlimAshley Deangleo Passage RN

## 2018-03-12 NOTE — Telephone Encounter (Signed)
Called patient, left a generic message for her to call her doctors office back. Angeline SlimAshley Hill RN

## 2018-03-12 NOTE — Telephone Encounter (Signed)
-----   Message from Veryl SpeakGregory D Calone, FNP sent at 03/08/2018 12:09 PM EDT ----- Please inform Ms. Phegley that her Hepatitis C viral load is non-detectable so no treatment is necessary.

## 2018-03-13 ENCOUNTER — Ambulatory Visit: Payer: Managed Care, Other (non HMO) | Admitting: Physician Assistant

## 2018-03-26 NOTE — Progress Notes (Signed)
Pamela Cowan  MRN: [id number] DOB: Aug 29, 1975  Subjective:  Pamela Cowan is a 42 y.o. female seen in office today for a chief complaint of multiple issues.   1) Anxiety and depression: Has been dealing with this over the past year. It has gotten very bad over the past few months. She takes on everyone's stress and this is getting to her. It is near the anniversary of her grandfather's death day, her dad has cancer, cousin is on life support, grandmother recently had stroke. 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 She goes from here to [place] multiple times a week. Works 40+ hours at [employer]. Has noticed dysphoric mood, frequent episodes of crying, excessive worrying, and irritability. Denies SI and HI. Has been going to counseling since 09/2017, goes once every 2 weeks. Her counselor has recommend she start antidepressant medication. Has not wanted to start medication for this but at this point "I feel unstable, in a dark place." [place] auditory/visual hallucinations, grandiose thoughts, impulsive behaviors, euphoria. Denies illicit drug use. No PMH of seizure activity.   2) Right sided flank pain x 1 week. Described as sharp and contracting. Notes she cannot recall a particular incident that provoked the pain. Can occur at anytime, no certain provoking movements. Will last seconds then go away. Rates in 10/10. Has associated urinary frequency. Denies acute injury, SOB, chest pain, cough, fever, abdominal pain, vomiting, hematuria, urgency, numbness, tingling, and weakness. Denies saddle anesthesia and bladder/bowel incontinence. Does lift heavy boxes at [employer]. Has prior hx of muscle spasms. No prior hx of kidney stones.   Review of Systems  Per HPI  Patient Active Problem List   Diagnosis Date Noted  . Hepatitis C antibody test positive 03/06/2018    No current outpatient medications on file prior to visit.   No current facility-administered medications on file prior to  visit.     Allergies  Allergen Reactions  . Other Anaphylaxis    ALL seafoods- fish, shellfish, etc..  . Shellfish Allergy Anaphylaxis      Social History   Socioeconomic History  . Marital status: Single    Spouse name: Not on file  . Number of children: 0  . Years of education: Not on file  . Highest education level: Some college, no degree  Occupational History  . Not on file  Social Needs  . Financial resource strain: Not hard at all  . Food insecurity:    Worry: Never true    Inability: Never true  . Transportation needs:    Medical: No    Non-medical: No  Tobacco Use  . Smoking status: Never Smoker  . Smokeless tobacco: Never Used  Substance and Sexual Activity  . Alcohol use: Not Currently    Alcohol/week: 1.0 standard drinks    Types: 1 Glasses of wine per week  . Drug use: Not Currently  . Sexual activity: Yes  Lifestyle  . Physical activity:    Days per week: 1 day    Minutes per session: 60 min  . Stress: To some extent  Relationships  . Social connections:    Talks on phone: Twice a week    Gets together: Never    Attends religious service: More than 4 times per year    Active member of club or organization: Yes    Attends meetings of clubs or organizations: More than 4 times per year    Relationship status: Living with partner  . Intimate partner violence:  Fear of current or ex partner: No    Emotionally abused: No    Physically abused: No    Forced sexual activity: No  Other Topics Concern  . Not on file  Social History Narrative   Pt is from [place]. Moved to [place] 5 years. Has a partner of 8 years. Her cousin lives with her.     Objective:  BP 115/79   Pulse 89   Temp 98.2 F (36.8 C) (Oral)   Resp 18   Ht 5\' 10"  (1.778 m)   Wt 287 lb 3.2 oz (130.3 kg)   SpO2 99%   BMI 41.21 kg/m   Physical Exam: Constitutional: She is oriented to person, place, and time and well-developed, well-nourished, and in no distress.  Large  body habitus noted.   HENT:  Head: Normocephalic and atraumatic.  Eyes: Conjunctivae are normal.  Neck: Normal range of motion.  Pulmonary/Chest: Effort normal and breath sounds normal. She has no wheezes. She has no rhonchi. She has no rales.  Abdominal: Soft. Normal appearance and bowel sounds are normal. There is no tenderness. There is no CVA tenderness.  Musculoskeletal:       Cervical back: Normal.       Thoracic back: She exhibits decreased range of motion and tenderness. She exhibits no bony tenderness, no swelling and no spasm.       Lumbar back: Normal.       Back:  Neurological: She is alert and oriented to person, place, and time. She has a normal Straight Leg Raise Test. Gait normal.  Reflex Scores:      Patellar reflexes are 2+ on the right side and 2+ on the left side.      Achilles reflexes are 2+ on the right side and 2+ on the left side. Muscular strength of bilateral lower extremities 5/5.  Sensation of bilateral lower extremities intact.   Skin: Skin is warm and dry.  Psychiatric: Affect normal. Depressed mood noted.  Vitals reviewed.  GAD 7 : Generalized Anxiety Score 03/27/2018  Nervous, Anxious, on Edge 3  Control/stop worrying 3  Worry too much - different things 3  Trouble relaxing 3  Restless 3  Easily annoyed or irritable 2  Afraid - awful might happen 2  Total GAD 7 Score 19  Anxiety Difficulty Somewhat difficult    Depression screen [facility] 2/9 03/27/2018 03/06/2018 01/08/2018 08/17/2017 05/19/2017  Decreased Interest 1 0 0 0 0  Down, Depressed, Hopeless 2 0 0 0 0  PHQ - 2 Score 3 0 0 0 0  Altered sleeping 3 - - - -  Tired, decreased energy 3 - - - -  Change in appetite 3 - - - -  Feeling bad or failure about yourself  2 - - - -  Trouble concentrating 3 - - - -  Moving slowly or fidgety/restless 1 - - - -  Suicidal thoughts 1 - - - -  PHQ-9 Score 19 - - - -  Difficult doing work/chores Somewhat difficult - - - -   Results for orders placed or  performed in visit on 03/27/18 (from the past 24 hour(s))  POCT CBC     Status: Abnormal   Collection Time: 03/27/18 11:42 AM  Result Value Ref Range   WBC 7.2 4.6 - 10.2 K/uL   Lymph, poc 3.3 0.6 - 3.4   POC LYMPH PERCENT 45.6 10 - 50 %L   MID (cbc) 0.5 0 - 0.9   POC MID % 7.2  0 - 12 %M   POC Granulocyte 3.4 2 - 6.9   Granulocyte percent 47.2 37 - 80 %G   RBC 4.40 4.04 - 5.48 M/uL   Hemoglobin 11.5 (A) 12.2 - 16.2 g/dL   HCT, POC 16.1 (A) 09.6 - 47.9 %   MCV 79.6 (A) 80 - 97 fL   MCH, POC 26.3 (A) 27 - 31.2 pg   MCHC 32.9 31.8 - 35.4 g/dL   RDW, POC 04.5 %   Platelet Count, POC 384 142 - 424 K/uL   MPV 7.3 0 - 99.8 fL  POCT urinalysis dipstick     Status: None   Collection Time: 03/27/18 11:49 AM  Result Value Ref Range   Color, UA yellow yellow   Clarity, UA clear clear   Glucose, UA negative negative mg/dL   Bilirubin, UA negative negative   Ketones, POC UA negative negative mg/dL   Spec Grav, UA 4.098 1.191 - 1.025   Blood, UA negative negative   pH, UA 7.0 5.0 - 8.0   Protein Ur, POC negative negative mg/dL   Urobilinogen, UA 0.2 0.2 or 1.0 E.U./dL   Nitrite, UA Negative Negative   Leukocytes, UA Negative Negative  POCT Microscopic Urinalysis ([facility])     Status: Abnormal   Collection Time: 03/27/18 11:50 AM  Result Value Ref Range   WBC,UR,HPF,POC None None WBC/hpf   RBC,UR,HPF,POC None None RBC/hpf   Bacteria None None, Too numerous to count   Mucus Absent Absent   Epithelial Cells, UR Per Microscopy Moderate (A) None, Too numerous to count cells/hpf    Assessment and Plan :  1. Anxiety and depression Uncontrolled at this time.  She is not suicidal or homicidal.  Recommend continuing counseling and starting SSRI at this time.  Follow-up in 4 weeks for reevaluation. - escitalopram (LEXAPRO) 10 MG tablet; Take 1 tablet (10 mg total) by mouth daily.  Dispense: 90 tablet; Refill: 0  2. Urinary frequency Normal UA and urine micro.  - POCT urinalysis dipstick -  POCT Microscopic Urinalysis ([facility])  3. Acute right-sided thoracic back pain Hx and PE findings consistent with muscle spasm. No palpable spasm noted on exam but right sided thoracic musculature is tight upon palpation and pain is reproducible on exam. She is overall well appearing, no distress. Vitals stable. Pt reassured that no hematuria on UA. May use muscle relaxants, NSAIDs, heating pad, and perform daily stretches as tolerated. Avoid heavy lifting until pain resolves.  Given educational material on stretching. Advised to return to clinic if symptoms worsen, do not improve, or as needed. - POCT urinalysis dipstick - POCT Microscopic Urinalysis ([facility]) - POCT CBC - Basic metabolic panel - cyclobenzaprine (FLEXERIL) 5 MG tablet; Take 1 tablet (5 mg total) by mouth 3 (three) times daily as needed for muscle spasms.  Dispense: 60 tablet; Refill: 0 - meloxicam (MOBIC) 7.5 MG tablet; Take 1 tablet (7.5 mg total) by mouth daily.  Dispense: 30 tablet; Refill: 0  Side effects, risks, benefits, and alternatives of the medications and treatment plan prescribed today were discussed, and patient expressed understanding of the instructions given. No barriers to understanding were identified. Red flags discussed in detail. Pt expressed understanding regarding what to do in case of emergency/urgent symptoms.  A total of 40 minutes was spent in the room with the patient, greater than 50% of which was in counseling/coordination of care regarding anxiety, depression, and acute back pain.  Benjiman Core PA-C  [facility] at [facility] 03/27/2018 11:52 AM

## 2018-03-27 ENCOUNTER — Other Ambulatory Visit: Payer: Self-pay

## 2018-03-27 ENCOUNTER — Encounter: Payer: Self-pay | Admitting: Physician Assistant

## 2018-03-27 ENCOUNTER — Ambulatory Visit: Payer: Managed Care, Other (non HMO) | Admitting: Physician Assistant

## 2018-03-27 VITALS — BP 115/79 | HR 89 | Temp 98.2°F | Resp 18 | Ht 70.0 in | Wt 287.2 lb

## 2018-03-27 DIAGNOSIS — F419 Anxiety disorder, unspecified: Secondary | ICD-10-CM

## 2018-03-27 DIAGNOSIS — F329 Major depressive disorder, single episode, unspecified: Secondary | ICD-10-CM

## 2018-03-27 DIAGNOSIS — M546 Pain in thoracic spine: Secondary | ICD-10-CM | POA: Diagnosis not present

## 2018-03-27 DIAGNOSIS — R350 Frequency of micturition: Secondary | ICD-10-CM

## 2018-03-27 LAB — POC MICROSCOPIC URINALYSIS (UMFC): Mucus: ABSENT

## 2018-03-27 LAB — POCT URINALYSIS DIP (MANUAL ENTRY)
BILIRUBIN UA: NEGATIVE
Glucose, UA: NEGATIVE mg/dL
Ketones, POC UA: NEGATIVE mg/dL
Leukocytes, UA: NEGATIVE
Nitrite, UA: NEGATIVE
PH UA: 7 (ref 5.0–8.0)
Protein Ur, POC: NEGATIVE mg/dL
RBC UA: NEGATIVE
Spec Grav, UA: 1.015 (ref 1.010–1.025)
Urobilinogen, UA: 0.2 E.U./dL

## 2018-03-27 LAB — POCT CBC
GRANULOCYTE PERCENT: 47.2 % (ref 37–80)
HCT, POC: 35 % — AB (ref 37.7–47.9)
HEMOGLOBIN: 11.5 g/dL — AB (ref 12.2–16.2)
LYMPH, POC: 3.3 (ref 0.6–3.4)
MCH, POC: 26.3 pg — AB (ref 27–31.2)
MCHC: 32.9 g/dL (ref 31.8–35.4)
MCV: 79.6 fL — AB (ref 80–97)
MID (cbc): 0.5 (ref 0–0.9)
MPV: 7.3 fL (ref 0–99.8)
POC Granulocyte: 3.4 (ref 2–6.9)
POC LYMPH PERCENT: 45.6 %L (ref 10–50)
POC MID %: 7.2 %M (ref 0–12)
Platelet Count, POC: 384 10*3/uL (ref 142–424)
RBC: 4.4 M/uL (ref 4.04–5.48)
RDW, POC: 14.3 %
WBC: 7.2 10*3/uL (ref 4.6–10.2)

## 2018-03-27 LAB — BASIC METABOLIC PANEL
BUN / CREAT RATIO: 8 — AB (ref 9–23)
BUN: 8 mg/dL (ref 6–24)
CO2: 23 mmol/L (ref 20–29)
Calcium: 9.4 mg/dL (ref 8.7–10.2)
Chloride: 102 mmol/L (ref 96–106)
Creatinine, Ser: 0.96 mg/dL (ref 0.57–1.00)
GFR calc non Af Amer: 74 mL/min/{1.73_m2} (ref >59)
GFR, EST AFRICAN AMERICAN: 86 mL/min/{1.73_m2} (ref >59)
Glucose: 82 mg/dL (ref 65–99)
Potassium: 4.3 mmol/L (ref 3.5–5.2)
Sodium: 140 mmol/L (ref 134–144)

## 2018-03-27 MED ORDER — MELOXICAM 7.5 MG PO TABS: 7.5000 mg | ORAL_TABLET | Freq: Every day | ORAL | 0 refills | Status: AC

## 2018-03-27 MED ORDER — CYCLOBENZAPRINE HCL 5 MG PO TABS: 5.0000 mg | ORAL_TABLET | Freq: Three times a day (TID) | ORAL | 0 refills | Status: AC | PRN

## 2018-03-27 MED ORDER — ESCITALOPRAM OXALATE 10 MG PO TABS: 10.0000 mg | ORAL_TABLET | Freq: Every day | ORAL | 0 refills | Status: AC

## 2018-03-27 NOTE — Patient Instructions (Addendum)
For depression and anxiety, I recommend we start a daily SSRI and weekly/monthly therapy. The SSRI I am prescribing is called lexapro. Start with  Please keep in mind that when you start an SSRI it can worsen your depression and anxiety symptoms. It can also increase risk of suicidal thoughts so if this occurs, please seek care immediately. Common side effects of SSRIs include, but are not limited to, GI upset, nausea, vomiting, insomnia, and drowsiness. Typically these side effects will resolve after 2 weeks. Please keep in mind that it can take up to 4-6 weeks for SSRIs to be fully effective. Plan to return to clinic in 4 weeks for reevaluation of symptoms.    For back pain, I recommend resting today. However, tomorrow I would begin walking and moving around as much as tolerated. Begin stretching in a couple of days. The worse thing you can do for low back pain is lie in bed all day or sit down all day. Use medications as needed. I also recommend increasing the amount of water you drink as dehydration can lead to muscle spasms.   Just to know, flexeril can cause side effects that may impair your thinking or reactions. Be careful if you drive or do anything that requires you to be awake and alert. void drinking alcohol, which can increase some of the side effects of Flexeril.  NSAIDs like meloxicam have common side effects of heartburn, stomach pain, indigestion, and headache. Could lead to renal insufficiency, stroke, or GI bleed if taken excess amounts outside of what is recommended on label long term.    You should avoid heavy lifting or strenuous repetitive activity to prevent recurrence of event. Experiment with both ice and heat and choose whichever feels best for you.  Use heat pad or ice pack, do not apply directly to skin, use barrier such as towel over the skin. Leave on for 15-20 minutes, 3-4 times a day.  Please perform exercises below. Stretches are to be performed for 2 sets, holding 10-15  seconds each. Recommended to perform this rehab twice daily within pain tolerance for 2 weeks.   FLEXION RANGE OF MOTION AND STRETCHING EXERCISES: STRETCH - Flexion, Single Knee to Chest   Lie on a firm bed or floor with both legs extended in front of you.  Keeping one leg in contact with the floor, bring your opposite knee to your chest. Hold your leg in place by either grabbing behind your thigh or at your knee.  Pull until you feel a gentle stretch in your lower back.   Slowly release your grasp and repeat the exercise with the opposite side.  STRETCH - Flexion, Double Knee to Chest   Lie on a firm bed or floor with both legs extended in front of you.  Keeping one leg in contact with the floor, bring your opposite knee to your chest.  Tense your stomach muscles to support your back and then lift your other knee to your chest. Hold your legs in place by either grabbing behind your thighs or at your knees.  Pull both knees toward your chest until you feel a gentle stretch in your lower back.   Tense your stomach muscles and slowly return one leg at a time to the floor.  STRETCH - Low Trunk Rotation  Lie on a firm bed or floor. Keeping your legs in front of you, bend your knees so they are both pointed toward the ceiling and your feet are flat on the floor.  Extend your arms out to the side. This will stabilize your upper body by keeping your shoulders in contact with the floor.  Gently and slowly drop both knees together to one side until you feel a gentle stretch in your lower back.   Tense your stomach muscles to support your lower back as you bring your knees back to the starting position. Repeat the exercise to the other side.   EXTENSION RANGE OF MOTION AND FLEXIBILITY EXERCISES: STRETCH - Extension, Prone on Elbows   Lie on your stomach on the floor, a bed will be too soft. Place your palms about shoulder width apart and at the height of your head.  Place your elbows  under your shoulders. If this is too painful, stack pillows under your chest.  Allow your body to relax so that your hips drop lower and make contact more completely with the floor.  Slowly return to lying flat on the floor.  RANGE OF MOTION - Extension, Prone Press Ups  Lie on your stomach on the floor, a bed will be too soft. Place your palms about shoulder width apart and at the height of your head.  Keeping your back as relaxed as possible, slowly straighten your elbows while keeping your hips on the floor. You may adjust the placement of your hands to maximize your comfort. As you gain motion, your hands will come more underneath your shoulders.  Slowly return to lying flat on the floor.  RANGE OF MOTION- Quadruped, Neutral Spine   Assume a hands and knees position on a firm surface. Keep your hands under your shoulders and your knees under your hips. You may place padding under your knees for comfort.  Drop your head and point your tail bone toward the ground below you. This will round out your lower back like an angry cat.    Slowly lift your head and release your tail bone so that your back sags into a large arch, like an old horse.  Repeat this until you feel limber in your lower back.  Now, find your "sweet spot." This will be the most comfortable position somewhere between the two previous positions. This is your neutral spine. Once you have found this position, tense your stomach muscles to support your lower back.  STRENGTHENING EXERCISES - Low Back Strain These exercises may help you when beginning to rehabilitate your injury. These exercises should be done near your "sweet spot." This is the neutral, low-back arch, somewhere between fully rounded and fully arched, that is your least painful position. When performed in this safe range of motion, these exercises can be used for people who have either a flexion or extension based injury. These exercises may resolve your  symptoms with or without further involvement from your physician, physical therapist or athletic trainer. While completing these exercises, remember:   Muscles can gain both the endurance and the strength needed for everyday activities through controlled exercises.  Complete these exercises as instructed by your physician, physical therapist or athletic trainer. Increase the resistance and repetitions only as guided.  You may experience muscle soreness or fatigue, but the pain or discomfort you are trying to eliminate should never worsen during these exercises. If this pain does worsen, stop and make certain you are following the directions exactly. If the pain is still present after adjustments, discontinue the exercise until you can discuss the trouble with your caregiver.  STRENGTHENING - Deep Abdominals, Pelvic Tilt  Lie on a firm bed or floor.  Keeping your legs in front of you, bend your knees so they are both pointed toward the ceiling and your feet are flat on the floor.  Tense your lower abdominal muscles to press your lower back into the floor. This motion will rotate your pelvis so that your tail bone is scooping upwards rather than pointing at your feet or into the floor.  STRENGTHENING - Abdominals, Crunches   Lie on a firm bed or floor. Keeping your legs in front of you, bend your knees so they are both pointed toward the ceiling and your feet are flat on the floor. Cross your arms over your chest.  Slightly tip your chin down without bending your neck.  Tense your abdominals and slowly lift your trunk high enough to just clear your shoulder blades. Lifting higher can put excessive stress on the lower back and does not further strengthen your abdominal muscles.  Control your return to the starting position.  STRENGTHENING - Quadruped, Opposite UE/LE Lift   Assume a hands and knees position on a firm surface. Keep your hands under your shoulders and your knees under your hips.  You may place padding under your knees for comfort.  Find your neutral spine and gently tense your abdominal muscles so that you can maintain this position. Your shoulders and hips should form a rectangle that is parallel with the floor and is not twisted.  Keeping your trunk steady, lift your right hand no higher than your shoulder and then your left leg no higher than your hip. Make sure you are not holding your breath.   Continuing to keep your abdominal muscles tense and your back steady, slowly return to your starting position. Repeat with the opposite arm and leg.  STRENGTHENING - Lower Abdominals, Double Knee Lift  Lie on a firm bed or floor. Keeping your legs in front of you, bend your knees so they are both pointed toward the ceiling and your feet are flat on the floor.  Tense your abdominal muscles to brace your lower back and slowly lift both of your knees until they come over your hips. Be certain not to hold your breath.  POSTURE AND BODY MECHANICS CONSIDERATIONS - Low Back Strain Keeping correct posture when sitting, standing or completing your activities will reduce the stress put on different body tissues, allowing injured tissues a chance to heal and limiting painful experiences. The following are general guidelines for improved posture. Your physician or physical therapist will provide you with any instructions specific to your needs. While reading these guidelines, remember:  The exercises prescribed by your provider will help you have the flexibility and strength to maintain correct postures.  The correct posture provides the best environment for your joints to work. All of your joints have less wear and tear when properly supported by a spine with good posture. This means you will experience a healthier, less painful body.  Correct posture must be practiced with all of your activities, especially prolonged sitting and standing. Correct posture is as important when doing  repetitive low-stress activities (typing) as it is when doing a single heavy-load activity (lifting). RESTING POSITIONS Consider which positions are most painful for you when choosing a resting position. If you have pain with flexion-based activities (sitting, bending, stooping, squatting), choose a position that allows you to rest in a less flexed posture. You would want to avoid curling into a fetal position on your side. If your pain worsens with extension-based activities (prolonged standing, working overhead), avoid  resting in an extended position such as sleeping on your stomach. Most people will find more comfort when they rest with their spine in a more neutral position, neither too rounded nor too arched. Lying on a non-sagging bed on your side with a pillow between your knees, or on your back with a pillow under your knees will often provide some relief. Keep in mind, being in any one position for a prolonged period of time, no matter how correct your posture, can still lead to stiffness. PROPER SITTING POSTURE In order to minimize stress and discomfort on your spine, you must sit with correct posture. Sitting with good posture should be effortless for a healthy body. Returning to good posture is a gradual process. Many people can work toward this most comfortably by using various supports until they have the flexibility and strength to maintain this posture on their own. When sitting with proper posture, your ears will fall over your shoulders and your shoulders will fall over your hips. You should use the back of the chair to support your upper back. Your lower back will be in a neutral position, just slightly arched. You may place a small pillow or folded towel at the base of your lower back for support.  When working at a desk, create an environment that supports good, upright posture. Without extra support, muscles tire, which leads to excessive strain on joints and other tissues. Keep these  recommendations in mind: CHAIR:  A chair should be able to slide under your desk when your back makes contact with the back of the chair. This allows you to work closely.  The chair's height should allow your eyes to be level with the upper part of your monitor and your hands to be slightly lower than your elbows. BODY POSITION  Your feet should make contact with the floor. If this is not possible, use a foot rest.  Keep your ears over your shoulders. This will reduce stress on your neck and lower back. INCORRECT SITTING POSTURES  If you are feeling tired and unable to assume a healthy sitting posture, do not slouch or slump. This puts excessive strain on your back tissues, causing more damage and pain. Healthier options include:  Using more support, like a lumbar pillow.  Switching tasks to something that requires you to be upright or walking.  Talking a brief walk.  Lying down to rest in a neutral-spine position. PROLONGED STANDING WHILE SLIGHTLY LEANING FORWARD  When completing a task that requires you to lean forward while standing in one place for a long time, place either foot up on a stationary 2-4 inch high object to help maintain the best posture. When both feet are on the ground, the lower back tends to lose its slight inward curve. If this curve flattens (or becomes too large), then the back and your other joints will experience too much stress, tire more quickly, and can cause pain. CORRECT STANDING POSTURES Proper standing posture should be assumed with all daily activities, even if they only take a few moments, like when brushing your teeth. As in sitting, your ears should fall over your shoulders and your shoulders should fall over your hips. You should keep a slight tension in your abdominal muscles to brace your spine. Your tailbone should point down to the ground, not behind your body, resulting in an over-extended swayback posture.  INCORRECT STANDING POSTURES  Common  incorrect standing postures include a forward head, locked knees and/or an excessive swayback.  WALKING Walk with an upright posture. Your ears, shoulders and hips should all line-up. PROLONGED ACTIVITY IN A FLEXED POSITION When completing a task that requires you to bend forward at your waist or lean over a low surface, try to find a way to stabilize 3 out of 4 of your limbs. You can place a hand or elbow on your thigh or rest a knee on the surface you are reaching across. This will provide you more stability so that your muscles do not fatigue as quickly. By keeping your knees relaxed, or slightly bent, you will also reduce stress across your lower back. CORRECT LIFTING TECHNIQUES DO :   Assume a wide stance. This will provide you more stability and the opportunity to get as close as possible to the object which you are lifting.  Tense your abdominals to brace your spine. Bend at the knees and hips. Keeping your back locked in a neutral-spine position, lift using your leg muscles. Lift with your legs, keeping your back straight.  Test the weight of unknown objects before attempting to lift them.  Try to keep your elbows locked down at your sides in order get the best strength from your shoulders when carrying an object.  Always ask for help when lifting heavy or awkward objects. INCORRECT LIFTING TECHNIQUES DO NOT:   Lock your knees when lifting, even if it is a small object.  Bend and twist. Pivot at your feet or move your feet when needing to change directions.  Assume that you can safely pick up even a paper clip without proper posture.       If you have lab work done today you will be contacted with your lab results within the next 2 weeks.  If you have not heard from [place] then please contact us. The fastest way to get your results is to register for My Chart.   IF you received an x-ray today, you will receive an invoice from [facility]. Please contact [facility]  [facility] at [phone] with questions or concerns regarding your invoice.   IF you received labwork today, you will receive an invoice from [facility]. Please contact [facility] at [phone] with questions or concerns regarding your invoice.   Our billing staff will not be able to assist you with questions regarding bills from these companies.  You will be contacted with the lab results as soon as they are available. The fastest way to get your results is to activate your My Chart account. Instructions are located on the last page of this paperwork. If you have not heard from [place] regarding the results in 2 weeks, please contact this office.

## 2018-04-30 ENCOUNTER — Ambulatory Visit: Payer: Managed Care, Other (non HMO) | Admitting: Physician Assistant

## 2018-04-30 NOTE — Progress Notes (Deleted)
   Pamela Cowan  MRN: [id number] DOB: 01-09-1976  Subjective:  Pamela Cowan is a 42 y.o. female seen in office today for a chief complaint of f/u on anxiety and depression. Was started on lexapro 10mg  daily on 03/27/18. Today,  Has noticed dysphoric mood, frequent episodes of crying, excessive worrying, and irritability. Denies SI and HI. Has been going to counseling since 09/2017, goes once every 2 weeks Review of Systems  Patient Active Problem List   Diagnosis Date Noted  . Hepatitis C antibody test positive 03/06/2018    Current Outpatient Medications on File Prior to Visit  Medication Sig Dispense Refill  . cyclobenzaprine (FLEXERIL) 5 MG tablet Take 1 tablet (5 mg total) by mouth 3 (three) times daily as needed for muscle spasms. 60 tablet 0  . escitalopram (LEXAPRO) 10 MG tablet Take 1 tablet (10 mg total) by mouth daily. 90 tablet 0  . meloxicam (MOBIC) 7.5 MG tablet Take 1 tablet (7.5 mg total) by mouth daily. 30 tablet 0   No current facility-administered medications on file prior to visit.     Allergies  Allergen Reactions  . Other Anaphylaxis    ALL seafoods- fish, shellfish, etc..  . Shellfish Allergy Anaphylaxis     Objective:  There were no vitals taken for this visit.  Physical Exam  Assessment and Plan :  *** There are no diagnoses linked to this encounter.   Benjiman Core PA-C  Primary Care at [facility] 04/30/2018 7:13 AM

## 2018-09-04 ENCOUNTER — Ambulatory Visit: Payer: Managed Care, Other (non HMO) | Admitting: Emergency Medicine

## 2018-09-04 ENCOUNTER — Encounter: Payer: Self-pay | Admitting: Emergency Medicine

## 2018-09-04 ENCOUNTER — Other Ambulatory Visit: Payer: Self-pay

## 2018-09-04 VITALS — BP 115/74 | HR 98 | Temp 98.9°F | Resp 16 | Ht 67.0 in | Wt 295.4 lb

## 2018-09-04 DIAGNOSIS — R7303 Prediabetes: Secondary | ICD-10-CM

## 2018-09-04 DIAGNOSIS — Z8719 Personal history of other diseases of the digestive system: Secondary | ICD-10-CM | POA: Diagnosis not present

## 2018-09-04 DIAGNOSIS — Z7689 Persons encountering health services in other specified circumstances: Secondary | ICD-10-CM | POA: Diagnosis not present

## 2018-09-04 NOTE — Progress Notes (Signed)
Pamela Cowan 43 y.o.  Lab Results  Component Value Date   HGBA1C 5.6 01/08/2018    Chief Complaint  Patient presents with  . Establish Care    former patient of Pamela Cowan  . prediabetes    per patient diagnosed by Pamela Cowan    HISTORY OF PRESENT ILLNESS: This is a 43 y.o. female with history of prediabetes here to establish care with me.  Has been seen Pamela Cowan.  Has a history of anxiety and depression, started on Lexapro at some point, discontinued due to side effects. Has a chronic dental problem and teeth have been falling.  Dental surgeon planning surgery to remove and replace teeth.  HPI   Prior to Admission medications   Medication Sig Start Date End Date Taking? Authorizing Provider  cyclobenzaprine (FLEXERIL) 5 MG tablet Take 1 tablet (5 mg total) by mouth 3 (three) times daily as needed for muscle spasms. Patient not taking: Reported on 09/04/2018 03/27/18   Pamela Delaine Cowan, Pamela-C  escitalopram (LEXAPRO) 10 MG tablet Take 1 tablet (10 mg total) by mouth daily. Patient not taking: Reported on 09/04/2018 03/27/18   Pamela Delaine Cowan, Pamela-C  meloxicam (MOBIC) 7.5 MG tablet Take 1 tablet (7.5 mg total) by mouth daily. Patient not taking: Reported on 09/04/2018 03/27/18   Pamela Delaine Cowan, Pamela-C    Allergies  Allergen Reactions  . Other Anaphylaxis    ALL seafoods- fish, shellfish, etc..  . Shellfish Allergy Anaphylaxis  . Escitalopram Other (See Comments)    Per patient caused appetite to increase    Patient Active Problem List   Diagnosis Date Noted  . Hepatitis C antibody test positive 03/06/2018    Past Medical History:  Diagnosis Date  . Diabetes mellitus without complication ([place])    pt just put on metformin  . Ear infection     Past Surgical History:  Procedure Laterality Date  . arm surgery     right   . FRACTURE SURGERY      Social History   Socioeconomic History  . Marital status: Single    Spouse name: Not on file  . Number of children:  0  . Years of education: Not on file  . Highest education level: Some college, no degree  Occupational History  . Not on file  Social Needs  . Financial resource strain: Not hard at all  . Food insecurity:    Worry: Never true    Inability: Never true  . Transportation needs:    Medical: No    Non-medical: No  Tobacco Use  . Smoking status: Never Smoker  . Smokeless tobacco: Never Used  Substance and Sexual Activity  . Alcohol use: Not Currently    Alcohol/week: 1.0 standard drinks    Types: 1 Glasses of wine per week  . Drug use: Not Currently  . Sexual activity: Yes  Lifestyle  . Physical activity:    Days per week: 1 day    Minutes per session: 60 min  . Stress: To some extent  Relationships  . Social connections:    Talks on phone: Twice a week    Gets together: Never    Attends religious service: More than 4 times per year    Active member of club or organization: Yes    Attends meetings of clubs or organizations: More than 4 times per year    Relationship status: Living with partner  . Intimate partner violence:    Fear of current or ex partner: No  Emotionally abused: No    Physically abused: No    Forced sexual activity: No  Other Topics Concern  . Not on file  Social History Narrative   Pt is from [place] to [place] 5 years. Has a partner of 8 years. Her cousin lives with her.     Family History  Problem Relation Age of Onset  . Diabetes Mother   . Hypertension Mother   . Cancer Father   . Hyperlipidemia Maternal Grandmother   . Hypertension Maternal Grandmother   . Diabetes Maternal Grandfather   . Mental illness Maternal Grandfather   . Dementia Paternal Grandmother      Review of Systems  Constitutional: Negative.  Negative for fever.  HENT:       Chronic dental issues.  Eyes: Negative.  Negative for blurred vision and double vision.  Respiratory: Negative.  Negative for cough and shortness of breath.   Cardiovascular:  Negative.  Negative for chest pain and palpitations.  Gastrointestinal: Negative.  Negative for abdominal pain, diarrhea, nausea and vomiting.  Genitourinary: Negative.   Musculoskeletal: Negative.   Skin: Negative.   Neurological: Negative.  Negative for dizziness and headaches.  Endo/Heme/Allergies: Negative.     Vitals:   09/04/18 1046  BP: 115/74  Pulse: 98  Resp: 16  Temp: 98.9 F (37.2 C)  SpO2: 100%    Physical Exam Vitals signs reviewed.  Constitutional:      Appearance: Normal appearance.  HENT:     Head: Normocephalic.     Nose: Nose normal.     Mouth/Throat:     Mouth: Mucous membranes are moist.     Pharynx: Oropharynx is clear.     Comments: Teeth in chronically bad condition Eyes:     Extraocular Movements: Extraocular movements intact.     Conjunctiva/sclera: Conjunctivae normal.     Pupils: Pupils are equal, round, and reactive to light.  Cardiovascular:     Rate and Rhythm: Normal rate and regular rhythm.     Heart sounds: Normal heart sounds.  Pulmonary:     Effort: Pulmonary effort is normal.     Breath sounds: Normal breath sounds.  Musculoskeletal: Normal range of motion.  Skin:    General: Skin is warm and dry.     Capillary Refill: Capillary refill takes less than 2 seconds.  Neurological:     General: No focal deficit present.     Mental Status: She is alert and oriented to person, place, and time.  Psychiatric:        Mood and Affect: Mood normal.        Behavior: Behavior normal.    A total of 25 minutes was spent in the room with the patient, greater than 50% of which was in counseling/coordination of care regarding chronic medical problems, review of medical records and blood results, treatment, medications, and need for follow-up.   ASSESSMENT & PLAN: Pamela Cowan was seen today for establish care and prediabetes.  Diagnoses and all orders for this visit:  Pre-diabetes -     Comprehensive metabolic panel -     CBC with  Differential/Platelet -     Lipid panel -     TSH -     Hemoglobin A1c  Encounter to establish care  History of dental problems   Patient Instructions       If you have lab work done today you will be contacted with your lab results within the next 2 weeks.  If you have not heard  from [place] then please contact us. The fastest way to get your results is to register for My Chart.   IF you received an x-ray today, you will receive an invoice from [facility]. Please contact [facility] at [phone] with questions or concerns regarding your invoice.   IF you received labwork today, you will receive an invoice from [facility]. Please contact [facility] at [phone] with questions or concerns regarding your invoice.   Our billing staff will not be able to assist you with questions regarding bills from these companies.  You will be contacted with the lab results as soon as they are available. The fastest way to get your results is to activate your My Chart account. Instructions are located on the last page of this paperwork. If you have not heard from [place] regarding the results in 2 weeks, please contact this office.    We recommend that you schedule a mammogram for breast cancer screening. Typically, you do not need a referral to do this. Please contact a local imaging center to schedule your mammogram.  [facility] - [phone]  *ask for the Radiology [place] ([place]) - [phone] or [phone]  [facility] - [phone] [place] [phone] [facility] - [phone]  *ask for the [facility] - [phone]  *ask for the Radiology Department [facility] - [phone]  *ask for the [facility] - [phone] Maintenance, Female Adopting a healthy lifestyle and getting preventive care can  go a long way to promote health and wellness. Talk with your health care provider about what schedule of regular examinations is right for you. This is a good chance for you to check in with your provider about disease prevention and staying healthy. In between checkups, there are plenty of things you can do on your own. Experts have done a lot of research about which lifestyle changes and preventive measures are most likely to keep you healthy. Ask your health care provider for more information. Weight and diet Eat a healthy diet  Be sure to include plenty of vegetables, fruits, low-fat dairy products, and lean protein.  Do not eat a lot of foods high in solid fats, added sugars, or salt.  Get regular exercise. This is one of the most important things you can do for your health. ? Most adults should exercise for at least 150 minutes each week. The exercise should increase your heart rate and make you sweat (moderate-intensity exercise). ? Most adults should also do strengthening exercises at least twice a week. This is in addition to the moderate-intensity exercise. Maintain a healthy weight  Body mass index (BMI) is a measurement that can be used to identify possible weight problems. It estimates body fat based on height and weight. Your health care provider can help determine your BMI and help you achieve or maintain a healthy weight.  For females 36 years of age and older: ? A BMI below 18.5 is considered underweight. ? A BMI of 18.5 to 24.9 is normal. ? A BMI of 25 to 29.9 is considered overweight. ? A BMI of 30 and above is considered obese. Watch levels of cholesterol and blood lipids  You should start having your blood tested for lipids and cholesterol at 42 years of age, then have this test every 5 years.  You may need to have your cholesterol levels checked more often if: ?  Your lipid or cholesterol levels are high. ? You are older than 42 years of age. ? You are at high risk  for heart disease. Cancer screening Lung Cancer  Lung cancer screening is recommended for adults 51-83 years old who are at high risk for lung cancer because of a history of smoking.  A yearly low-dose CT scan of the lungs is recommended for people who: ? Currently smoke. ? Have quit within the past 15 years. ? Have at least a 30-pack-year history of smoking. A pack year is smoking an average of one pack of cigarettes a day for 1 year.  Yearly screening should continue until it has been 15 years since you quit.  Yearly screening should stop if you develop a health problem that would prevent you from having lung cancer treatment. Breast Cancer  Practice breast self-awareness. This means understanding how your breasts normally appear and feel.  It also means doing regular breast self-exams. Let your health care provider know about any changes, no matter how small.  If you are in your 20s or 30s, you should have a clinical breast exam (CBE) by a health care provider every 1-3 years as part of a regular health exam.  If you are 24 or older, have a CBE every year. Also consider having a breast X-ray (mammogram) every year.  If you have a family history of breast cancer, talk to your health care provider about genetic screening.  If you are at high risk for breast cancer, talk to your health care provider about having an MRI and a mammogram every year.  Breast cancer gene (BRCA) assessment is recommended for women who have family members with BRCA-related cancers. BRCA-related cancers include: ? Breast. ? Ovarian. ? Tubal. ? Peritoneal cancers.  Results of the assessment will determine the need for genetic counseling and BRCA1 and BRCA2 testing. Cervical Cancer Your health care provider may recommend that you be screened regularly for cancer of the pelvic organs (ovaries, uterus, and vagina). This screening involves a pelvic examination, including checking for microscopic changes to the  surface of your cervix (Pap test). You may be encouraged to have this screening done every 3 years, beginning at age 32.  For women ages 66-65, health care providers may recommend pelvic exams and Pap testing every 3 years, or they may recommend the Pap and pelvic exam, combined with testing for human papilloma virus (HPV), every 5 years. Some types of HPV increase your risk of cervical cancer. Testing for HPV may also be done on women of any age with unclear Pap test results.  Other health care providers may not recommend any screening for nonpregnant women who are considered low risk for pelvic cancer and who do not have symptoms. Ask your health care provider if a screening pelvic exam is right for you.  If you have had past treatment for cervical cancer or a condition that could lead to cancer, you need Pap tests and screening for cancer for at least 20 years after your treatment. If Pap tests have been discontinued, your risk factors (such as having a new sexual partner) need to be reassessed to determine if screening should resume. Some women have medical problems that increase the chance of getting cervical cancer. In these cases, your health care provider may recommend more frequent screening and Pap tests. Colorectal Cancer  This type of cancer can be detected and often prevented.  Routine colorectal cancer screening usually begins at 42 years of age and continues  through 42 years of age.  Your health care provider may recommend screening at an earlier age if you have risk factors for colon cancer.  Your health care provider may also recommend using home test kits to check for hidden blood in the stool.  A small camera at the end of a tube can be used to examine your colon directly (sigmoidoscopy or colonoscopy). This is done to check for the earliest forms of colorectal cancer.  Routine screening usually begins at age 53.  Direct examination of the colon should be repeated every 5-10  years through 42 years of age. However, you may need to be screened more often if early forms of precancerous polyps or small growths are found. Skin Cancer  Check your skin from head to toe regularly.  Tell your health care provider about any new moles or changes in moles, especially if there is a change in a mole's shape or color.  Also tell your health care provider if you have a mole that is larger than the size of a pencil eraser.  Always use sunscreen. Apply sunscreen liberally and repeatedly throughout the day.  Protect yourself by wearing long sleeves, pants, a wide-brimmed hat, and sunglasses whenever you are outside. Heart disease, diabetes, and high blood pressure  High blood pressure causes heart disease and increases the risk of stroke. High blood pressure is more likely to develop in: ? People who have blood pressure in the high end of the normal range (130-139/85-89 mm Hg). ? People who are overweight or obese. ? People who are African American.  If you are 36-77 years of age, have your blood pressure checked every 3-5 years. If you are 53 years of age or older, have your blood pressure checked every year. You should have your blood pressure measured twice-once when you are at a hospital or clinic, and once when you are not at a hospital or clinic. Record the average of the two measurements. To check your blood pressure when you are not at a hospital or clinic, you can use: ? An automated blood pressure machine at a pharmacy. ? A home blood pressure monitor.  If you are between 23 years and 65 years old, ask your health care provider if you should take aspirin to prevent strokes.  Have regular diabetes screenings. This involves taking a blood sample to check your fasting blood sugar level. ? If you are at a normal weight and have a low risk for diabetes, have this test once every three years after 42 years of age. ? If you are overweight and have a high risk for diabetes,  consider being tested at a younger age or more often. Preventing infection Hepatitis B  If you have a higher risk for hepatitis B, you should be screened for this virus. You are considered at high risk for hepatitis B if: ? You were born in a country where hepatitis B is common. Ask your health care provider which countries are considered high risk. ? Your parents were born in a high-risk country, and you have not been immunized against hepatitis B (hepatitis B vaccine). ? You have HIV or AIDS. ? You use needles to inject street drugs. ? You live with someone who has hepatitis B. ? You have had sex with someone who has hepatitis B. ? You get hemodialysis treatment. ? You take certain medicines for conditions, including cancer, organ transplantation, and autoimmune conditions. Hepatitis C  Blood testing is recommended for: ? Everyone born  from [place] through 1964. ? Anyone with known risk factors for hepatitis C. Sexually transmitted infections (STIs)  You should be screened for sexually transmitted infections (STIs) including gonorrhea and chlamydia if: ? You are sexually active and are younger than 42 years of age. ? You are older than 42 years of age and your health care provider tells you that you are at risk for this type of infection. ? Your sexual activity has changed since you were last screened and you are at an increased risk for chlamydia or gonorrhea. Ask your health care provider if you are at risk.  If you do not have HIV, but are at risk, it may be recommended that you take a prescription medicine daily to prevent HIV infection. This is called pre-exposure prophylaxis (PrEP). You are considered at risk if: ? You are sexually active and do not regularly use condoms or know the HIV status of your partner(s). ? You take drugs by injection. ? You are sexually active with a partner who has HIV. Talk with your health care provider about whether you are at high risk of being  infected with HIV. If you choose to begin PrEP, you should first be tested for HIV. You should then be tested every 3 months for as long as you are taking PrEP. Pregnancy  If you are premenopausal and you may become pregnant, ask your health care provider about preconception counseling.  If you may become pregnant, take 400 to 800 micrograms (mcg) of folic acid every day.  If you want to prevent pregnancy, talk to your health care provider about birth control (contraception). Osteoporosis and menopause  Osteoporosis is a disease in which the bones lose minerals and strength with aging. This can result in serious bone fractures. Your risk for osteoporosis can be identified using a bone density scan.  If you are 23 years of age or older, or if you are at risk for osteoporosis and fractures, ask your health care provider if you should be screened.  Ask your health care provider whether you should take a calcium or vitamin Cowan supplement to lower your risk for osteoporosis.  Menopause may have certain physical symptoms and risks.  Hormone replacement therapy may reduce some of these symptoms and risks. Talk to your health care provider about whether hormone replacement therapy is right for you. Follow these instructions at home:  Schedule regular health, dental, and eye exams.  Stay current with your immunizations.  Do not use any tobacco products including cigarettes, chewing tobacco, or electronic cigarettes.  If you are pregnant, do not drink alcohol.  If you are breastfeeding, limit how much and how often you drink alcohol.  Limit alcohol intake to no more than 1 drink per day for nonpregnant women. One drink equals 12 ounces of beer, 5 ounces of wine, or 1 ounces of hard liquor.  Do not use street drugs.  Do not share needles.  Ask your health care provider for help if you need support or information about quitting drugs.  Tell your health care provider if you often feel  depressed.  Tell your health care provider if you have ever been abused or do not feel safe at home. This information is not intended to replace advice given to you by your health care provider. Make sure you discuss any questions you have with your health care provider. Document Released: 01/24/2011 Document Revised: 12/17/2015 Document Reviewed: 04/14/2015 Elsevier Interactive Patient Education  2019 [employer].  Rajiv Parlato, MD [facility] 

## 2018-09-04 NOTE — Patient Instructions (Addendum)
If you have lab work done today you will be contacted with your lab results within the next 2 weeks.  If you have not heard from [place] then please contact us. The fastest way to get your results is to register for My Chart.   IF you received an x-ray today, you will receive an invoice from [facility]. Please contact [facility] at [phone] with questions or concerns regarding your invoice.   IF you received labwork today, you will receive an invoice from [facility]. Please contact [facility] at [phone] with questions or concerns regarding your invoice.   Our billing staff will not be able to assist you with questions regarding bills from these companies.  You will be contacted with the lab results as soon as they are available. The fastest way to get your results is to activate your My Chart account. Instructions are located on the last page of this paperwork. If you have not heard from [place] regarding the results in 2 weeks, please contact this office.    We recommend that you schedule a mammogram for breast cancer screening. Typically, you do not need a referral to do this. Please contact a local imaging center to schedule your mammogram.  [facility] - [phone]  *ask for the Radiology [place] ([place]) - [phone] or [phone]  [facility] - [phone] [place] [phone] [facility] - [phone]  *ask for the [facility] - [phone]  *ask for the Radiology Department [facility] - [phone]  *ask for the [place] - [phone] Maintenance, Female Adopting a healthy lifestyle and getting preventive care can go a long way to promote health and wellness. Talk with your health care provider about what schedule of regular examinations is right for you. This is a good  chance for you to check in with your provider about disease prevention and staying healthy. In between checkups, there are plenty of things you can do on your own. Experts have done a lot of research about which lifestyle changes and preventive measures are most likely to keep you healthy. Ask your health care provider for more information. Weight and diet Eat a healthy diet  Be sure to include plenty of vegetables, fruits, low-fat dairy products, and lean protein.  Do not eat a lot of foods high in solid fats, added sugars, or salt.  Get regular exercise. This is one of the most important things you can do for your health. ? Most adults should exercise for at least 150 minutes each week. The exercise should increase your heart rate and make you sweat (moderate-intensity exercise). ? Most adults should also do strengthening exercises at least twice a week. This is in addition to the moderate-intensity exercise. Maintain a healthy weight  Body mass index (BMI) is a measurement that can be used to identify possible weight problems. It estimates body fat based on height and weight. Your health care provider can help determine your BMI and help you achieve or maintain a healthy weight.  For females 90 years of age and older: ? A BMI below 18.5 is considered underweight. ? A BMI of 18.5 to 24.9 is normal. ? A BMI of 25 to 29.9 is considered overweight. ? A BMI of 30 and above is considered obese. Watch levels of cholesterol and blood lipids  You should start having your blood tested for  lipids and cholesterol at 42 years of age, then have this test every 5 years.  You may need to have your cholesterol levels checked more often if: ? Your lipid or cholesterol levels are high. ? You are older than 42 years of age. ? You are at high risk for heart disease. Cancer screening Lung Cancer  Lung cancer screening is recommended for adults 67-77 years old who are at high risk for lung cancer because  of a history of smoking.  A yearly low-dose CT scan of the lungs is recommended for people who: ? Currently smoke. ? Have quit within the past 15 years. ? Have at least a 30-pack-year history of smoking. A pack year is smoking an average of one pack of cigarettes a day for 1 year.  Yearly screening should continue until it has been 15 years since you quit.  Yearly screening should stop if you develop a health problem that would prevent you from having lung cancer treatment. Breast Cancer  Practice breast self-awareness. This means understanding how your breasts normally appear and feel.  It also means doing regular breast self-exams. Let your health care provider know about any changes, no matter how small.  If you are in your 20s or 30s, you should have a clinical breast exam (CBE) by a health care provider every 1-3 years as part of a regular health exam.  If you are 6 or older, have a CBE every year. Also consider having a breast X-ray (mammogram) every year.  If you have a family history of breast cancer, talk to your health care provider about genetic screening.  If you are at high risk for breast cancer, talk to your health care provider about having an MRI and a mammogram every year.  Breast cancer gene (BRCA) assessment is recommended for women who have family members with BRCA-related cancers. BRCA-related cancers include: ? Breast. ? Ovarian. ? Tubal. ? Peritoneal cancers.  Results of the assessment will determine the need for genetic counseling and BRCA1 and BRCA2 testing. Cervical Cancer Your health care provider may recommend that you be screened regularly for cancer of the pelvic organs (ovaries, uterus, and vagina). This screening involves a pelvic examination, including checking for microscopic changes to the surface of your cervix (Pap test). You may be encouraged to have this screening done every 3 years, beginning at age 22.  For women ages 44-65, health care  providers may recommend pelvic exams and Pap testing every 3 years, or they may recommend the Pap and pelvic exam, combined with testing for human papilloma virus (HPV), every 5 years. Some types of HPV increase your risk of cervical cancer. Testing for HPV may also be done on women of any age with unclear Pap test results.  Other health care providers may not recommend any screening for nonpregnant women who are considered low risk for pelvic cancer and who do not have symptoms. Ask your health care provider if a screening pelvic exam is right for you.  If you have had past treatment for cervical cancer or a condition that could lead to cancer, you need Pap tests and screening for cancer for at least 20 years after your treatment. If Pap tests have been discontinued, your risk factors (such as having a new sexual partner) need to be reassessed to determine if screening should resume. Some women have medical problems that increase the chance of getting cervical cancer. In these cases, your health care provider may recommend more frequent screening and  Pap tests. Colorectal Cancer  This type of cancer can be detected and often prevented.  Routine colorectal cancer screening usually begins at 42 years of age and continues through 42 years of age.  Your health care provider may recommend screening at an earlier age if you have risk factors for colon cancer.  Your health care provider may also recommend using home test kits to check for hidden blood in the stool.  A small camera at the end of a tube can be used to examine your colon directly (sigmoidoscopy or colonoscopy). This is done to check for the earliest forms of colorectal cancer.  Routine screening usually begins at age 33.  Direct examination of the colon should be repeated every 5-10 years through 42 years of age. However, you may need to be screened more often if early forms of precancerous polyps or small growths are found. Skin  Cancer  Check your skin from head to toe regularly.  Tell your health care provider about any new moles or changes in moles, especially if there is a change in a mole's shape or color.  Also tell your health care provider if you have a mole that is larger than the size of a pencil eraser.  Always use sunscreen. Apply sunscreen liberally and repeatedly throughout the day.  Protect yourself by wearing long sleeves, pants, a wide-brimmed hat, and sunglasses whenever you are outside. Heart disease, diabetes, and high blood pressure  High blood pressure causes heart disease and increases the risk of stroke. High blood pressure is more likely to develop in: ? People who have blood pressure in the high end of the normal range (130-139/85-89 mm Hg). ? People who are overweight or obese. ? People who are African American.  If you are 7-82 years of age, have your blood pressure checked every 3-5 years. If you are 32 years of age or older, have your blood pressure checked every year. You should have your blood pressure measured twice-once when you are at a hospital or clinic, and once when you are not at a hospital or clinic. Record the average of the two measurements. To check your blood pressure when you are not at a hospital or clinic, you can use: ? An automated blood pressure machine at a pharmacy. ? A home blood pressure monitor.  If you are between 54 years and 7 years old, ask your health care provider if you should take aspirin to prevent strokes.  Have regular diabetes screenings. This involves taking a blood sample to check your fasting blood sugar level. ? If you are at a normal weight and have a low risk for diabetes, have this test once every three years after 42 years of age. ? If you are overweight and have a high risk for diabetes, consider being tested at a younger age or more often. Preventing infection Hepatitis B  If you have a higher risk for hepatitis B, you should be  screened for this virus. You are considered at high risk for hepatitis B if: ? You were born in a country where hepatitis B is common. Ask your health care provider which countries are considered high risk. ? Your parents were born in a high-risk country, and you have not been immunized against hepatitis B (hepatitis B vaccine). ? You have HIV or AIDS. ? You use needles to inject street drugs. ? You live with someone who has hepatitis B. ? You have had sex with someone who has hepatitis B. ?  You get hemodialysis treatment. ? You take certain medicines for conditions, including cancer, organ transplantation, and autoimmune conditions. Hepatitis C  Blood testing is recommended for: ? Everyone born from [birth year] through 1964. ? Anyone with known risk factors for hepatitis C. Sexually transmitted infections (STIs)  You should be screened for sexually transmitted infections (STIs) including gonorrhea and chlamydia if: ? You are sexually active and are younger than 42 years of age. ? You are older than 42 years of age and your health care provider tells you that you are at risk for this type of infection. ? Your sexual activity has changed since you were last screened and you are at an increased risk for chlamydia or gonorrhea. Ask your health care provider if you are at risk.  If you do not have HIV, but are at risk, it may be recommended that you take a prescription medicine daily to prevent HIV infection. This is called pre-exposure prophylaxis (PrEP). You are considered at risk if: ? You are sexually active and do not regularly use condoms or know the HIV status of your partner(s). ? You take drugs by injection. ? You are sexually active with a partner who has HIV. Talk with your health care provider about whether you are at high risk of being infected with HIV. If you choose to begin PrEP, you should first be tested for HIV. You should then be tested every 3 months for as long as you are taking  PrEP. Pregnancy  If you are premenopausal and you may become pregnant, ask your health care provider about preconception counseling.  If you may become pregnant, take 400 to 800 micrograms (mcg) of folic acid every day.  If you want to prevent pregnancy, talk to your health care provider about birth control (contraception). Osteoporosis and menopause  Osteoporosis is a disease in which the bones lose minerals and strength with aging. This can result in serious bone fractures. Your risk for osteoporosis can be identified using a bone density scan.  If you are 45 years of age or older, or if you are at risk for osteoporosis and fractures, ask your health care provider if you should be screened.  Ask your health care provider whether you should take a calcium or vitamin D supplement to lower your risk for osteoporosis.  Menopause may have certain physical symptoms and risks.  Hormone replacement therapy may reduce some of these symptoms and risks. Talk to your health care provider about whether hormone replacement therapy is right for you. Follow these instructions at home:  Schedule regular health, dental, and eye exams.  Stay current with your immunizations.  Do not use any tobacco products including cigarettes, chewing tobacco, or electronic cigarettes.  If you are pregnant, do not drink alcohol.  If you are breastfeeding, limit how much and how often you drink alcohol.  Limit alcohol intake to no more than 1 drink per day for nonpregnant women. One drink equals 12 ounces of beer, 5 ounces of wine, or 1 ounces of hard liquor.  Do not use street drugs.  Do not share needles.  Ask your health care provider for help if you need support or information about quitting drugs.  Tell your health care provider if you often feel depressed.  Tell your health care provider if you have ever been abused or do not feel safe at home. This information is not intended to replace advice given  to you by your health care provider. Make sure you discuss any  questions you have with your health care provider. Document Released: 01/24/2011 Document Revised: 12/17/2015 Document Reviewed: 04/14/2015 Elsevier Interactive Patient Education  2019 [employer].

## 2018-09-05 ENCOUNTER — Encounter: Payer: Self-pay | Admitting: *Deleted

## 2018-09-05 LAB — CBC WITH DIFFERENTIAL/PLATELET
Basophils Absolute: 0 10*3/uL (ref 0.0–0.2)
Basos: 1 %
EOS (ABSOLUTE): 0.3 10*3/uL (ref 0.0–0.4)
Eos: 4 %
Hematocrit: 32 % — ABNORMAL LOW (ref 34.0–46.6)
Hemoglobin: 10.4 g/dL — ABNORMAL LOW (ref 11.1–15.9)
Immature Grans (Abs): 0 10*3/uL (ref 0.0–0.1)
Immature Granulocytes: 0 %
Lymphocytes Absolute: 2.9 10*3/uL (ref 0.7–3.1)
Lymphs: 41 %
MCH: 25.3 pg — ABNORMAL LOW (ref 26.6–33.0)
MCHC: 32.5 g/dL (ref 31.5–35.7)
MCV: 78 fL — ABNORMAL LOW (ref 79–97)
Monocytes Absolute: 0.6 10*3/uL (ref 0.1–0.9)
Monocytes: 8 %
NEUTROS ABS: 3.3 10*3/uL (ref 1.4–7.0)
Neutrophils: 46 %
PLATELETS: 388 10*3/uL (ref 150–450)
RBC: 4.11 x10E6/uL (ref 3.77–5.28)
RDW: 14.6 % (ref 11.7–15.4)
WBC: 7.1 10*3/uL (ref 3.4–10.8)

## 2018-09-05 LAB — LIPID PANEL
Chol/HDL Ratio: 3.8 ratio (ref 0.0–4.4)
Cholesterol, Total: 214 mg/dL — ABNORMAL HIGH (ref 100–199)
HDL: 57 mg/dL (ref >39)
LDL CALC: 129 mg/dL — AB (ref 0–99)
Triglycerides: 139 mg/dL (ref 0–149)
VLDL CHOLESTEROL CAL: 28 mg/dL (ref 5–40)

## 2018-09-05 LAB — COMPREHENSIVE METABOLIC PANEL
A/G RATIO: 0.9 — AB (ref 1.2–2.2)
ALT: 10 IU/L (ref 0–32)
AST: 14 IU/L (ref 0–40)
Albumin: 3.9 g/dL (ref 3.8–4.8)
Alkaline Phosphatase: 100 IU/L (ref 39–117)
BILIRUBIN TOTAL: 0.4 mg/dL (ref 0.0–1.2)
BUN/Creatinine Ratio: 8 — ABNORMAL LOW (ref 9–23)
BUN: 8 mg/dL (ref 6–24)
CALCIUM: 9.2 mg/dL (ref 8.7–10.2)
CHLORIDE: 101 mmol/L (ref 96–106)
CO2: 24 mmol/L (ref 20–29)
Creatinine, Ser: 0.95 mg/dL (ref 0.57–1.00)
GFR, EST AFRICAN AMERICAN: 86 mL/min/{1.73_m2} (ref >59)
GFR, EST NON AFRICAN AMERICAN: 75 mL/min/{1.73_m2} (ref >59)
Globulin, Total: 4.3 g/dL (ref 1.5–4.5)
Glucose: 79 mg/dL (ref 65–99)
POTASSIUM: 4.4 mmol/L (ref 3.5–5.2)
Sodium: 141 mmol/L (ref 134–144)
Total Protein: 8.2 g/dL (ref 6.0–8.5)

## 2018-09-05 LAB — HEMOGLOBIN A1C
Est. average glucose Bld gHb Est-mCnc: 114 mg/dL
Hgb A1c MFr Bld: 5.6 % (ref 4.8–5.6)

## 2018-09-05 LAB — TSH: TSH: 1.47 u[IU]/mL (ref 0.450–4.500)

## 2018-10-31 IMAGING — CT CT HEAD W/O CM
3 series · 16 of 47 positions shown, 19 images · non-contrast
Comparison: None.

CLINICAL DATA: Headache, normal neurologic exam

EXAM:
CT HEAD WITHOUT CONTRAST
TECHNIQUE: Contiguous axial images were obtained from the base of the skull
through the vertex without intravenous contrast.

[Series 2: head wo · axial · 0.47mm/px · z∈[-172,-47]mm · 10 of 30 slices shown, 13 images]
[im 3/30  brain]
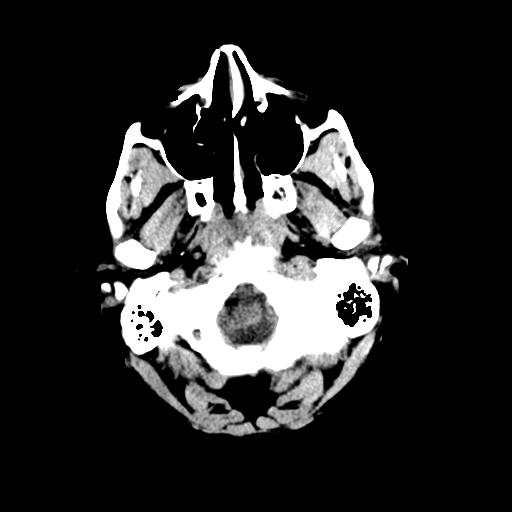
[im 3/30  bone]
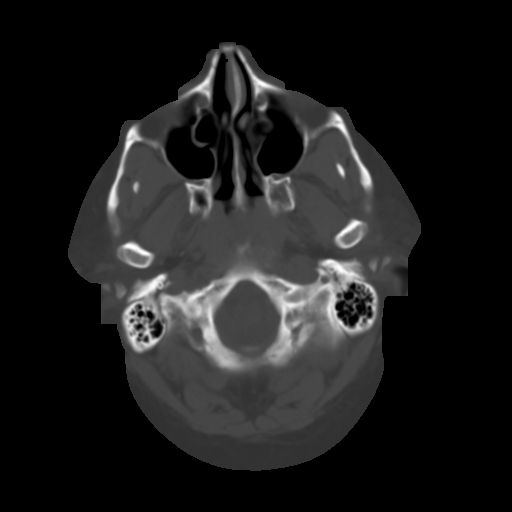
[im 6/30  brain]
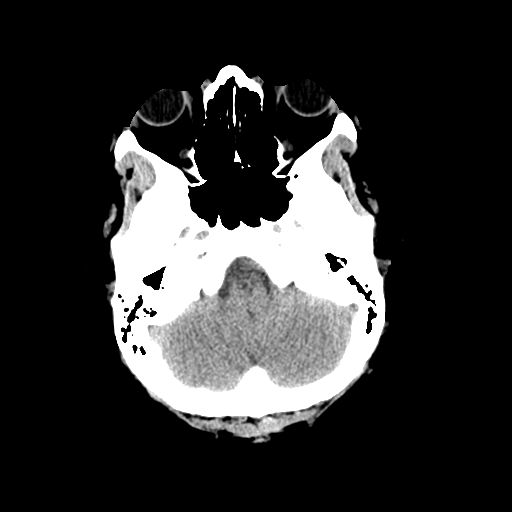
[im 9/30  brain]
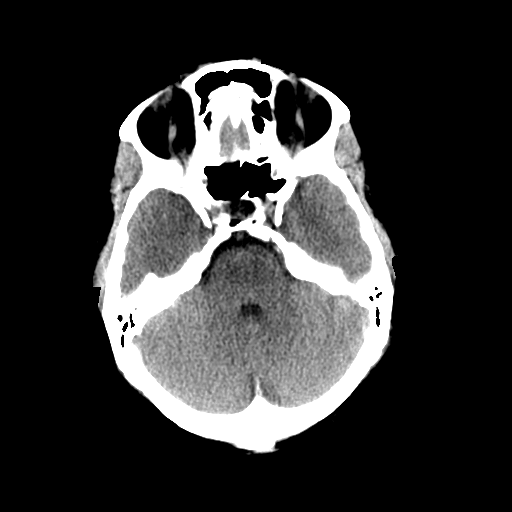
[im 11/30  brain]
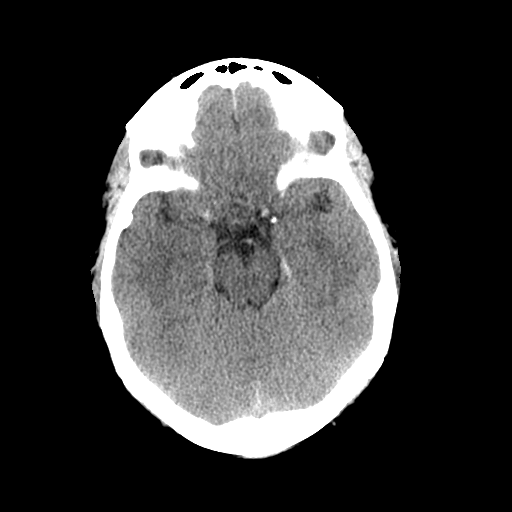
[im 14/30  brain]
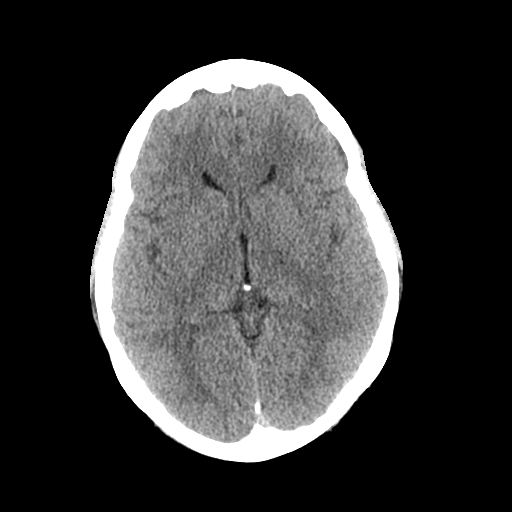
[im 14/30  bone]
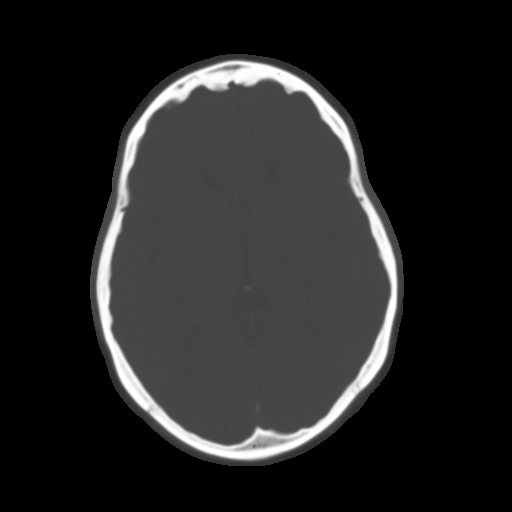
[im 17/30  brain]
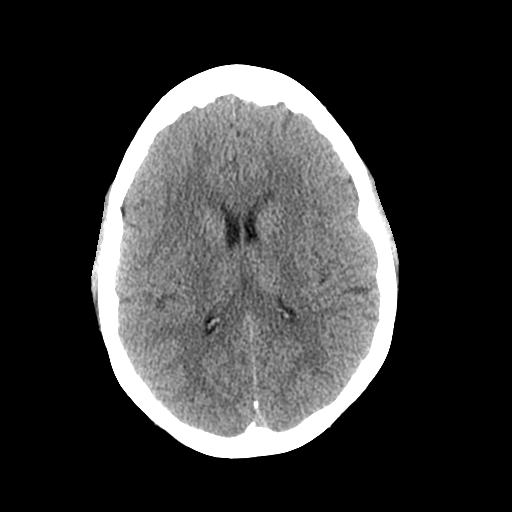
[im 20/30  brain]
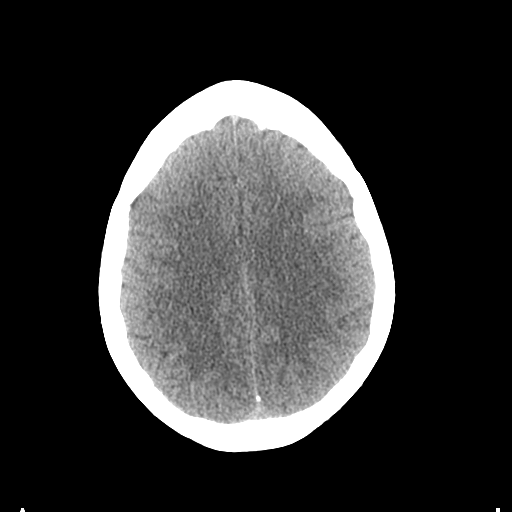
[im 23/30  brain]
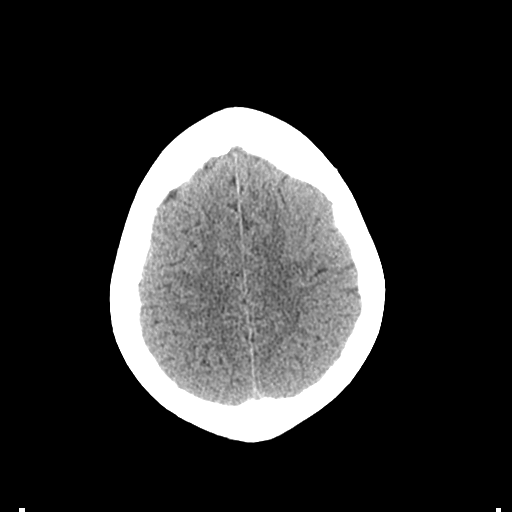
[im 25/30  brain]
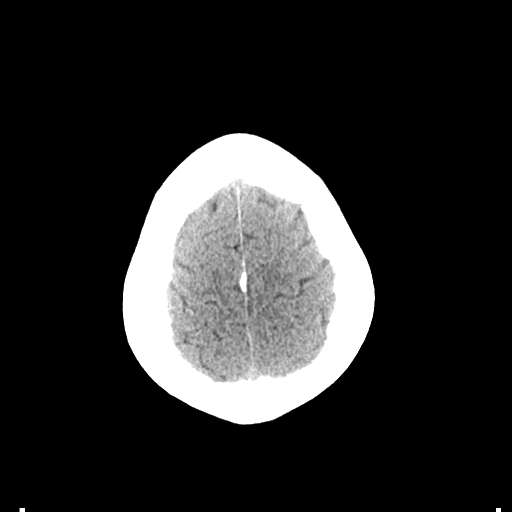
[im 25/30  bone]
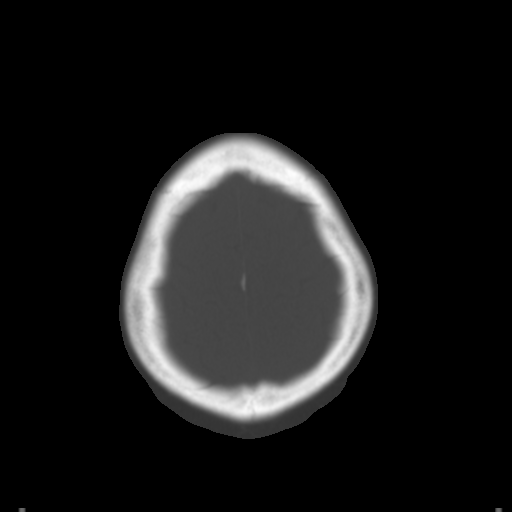
[im 28/30  brain]
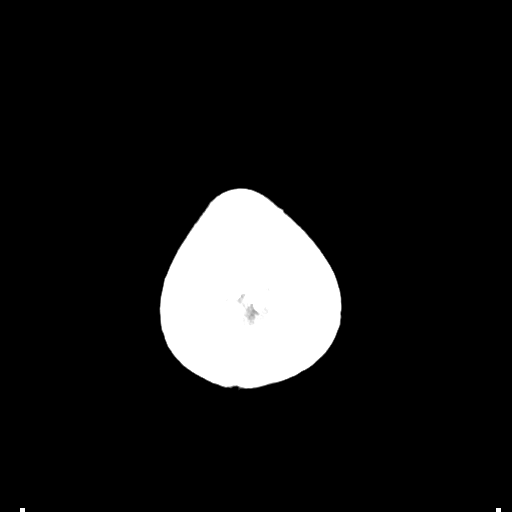

[Series 4: coronal soft tissue · coronal · 0.29mm/px · 3 of 64 slices shown]
[im 22/64  brain]
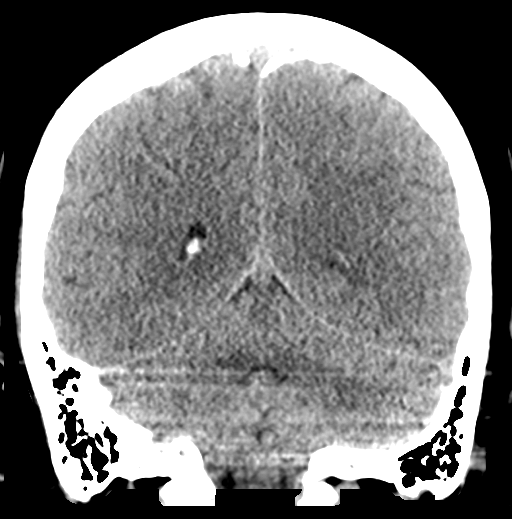
[im 29/64  brain]
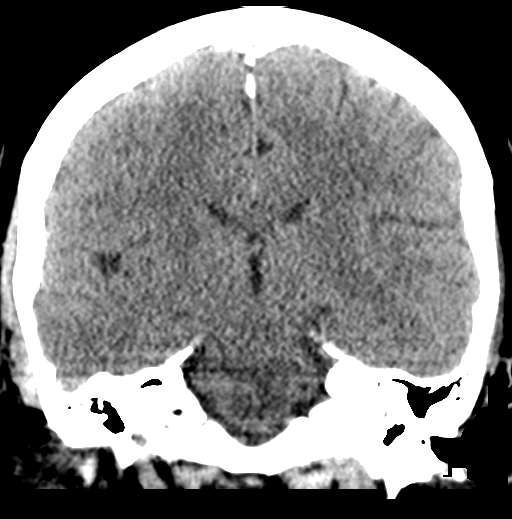
[im 36/64  brain]
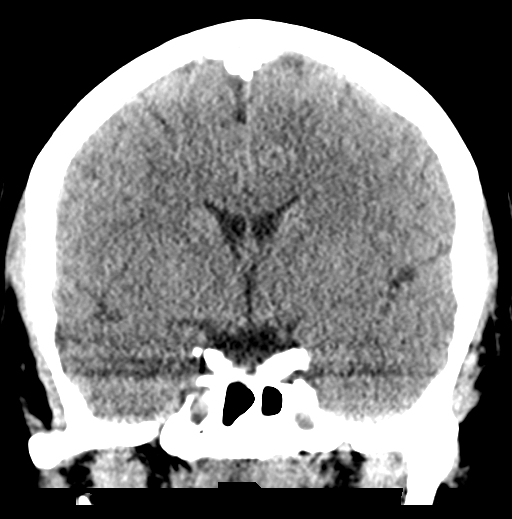

[Series 5: sagittal soft tissue · sagittal · 0.29mm/px · 3 of 49 slices shown]
[im 17/49  brain]
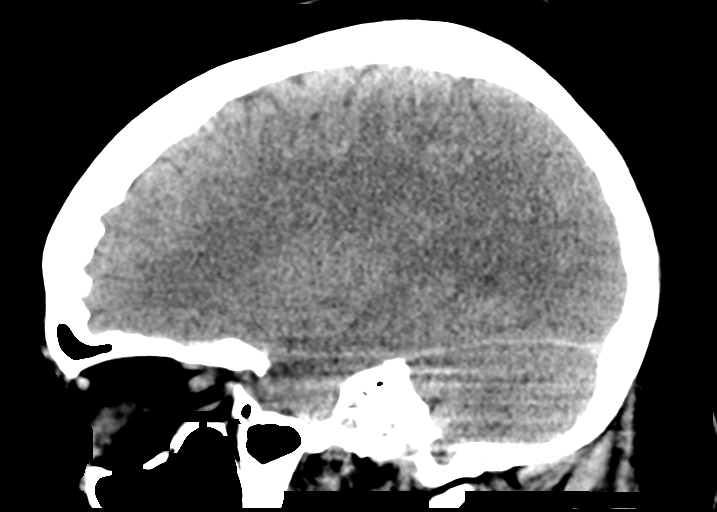
[im 25/49  brain]
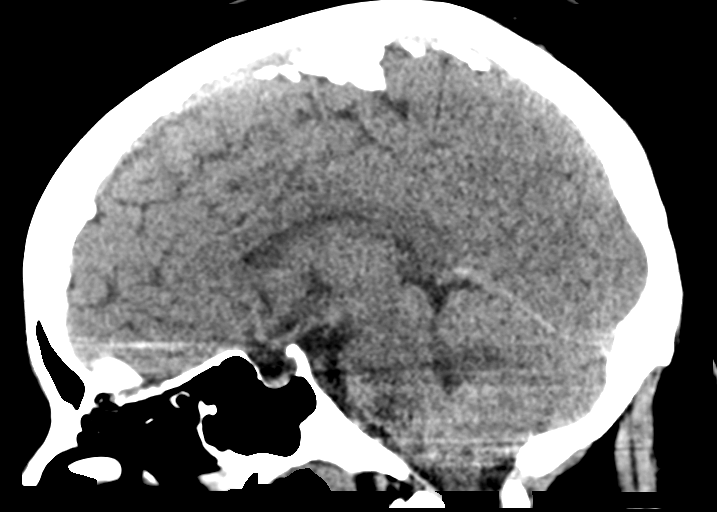
[im 33/49  brain]
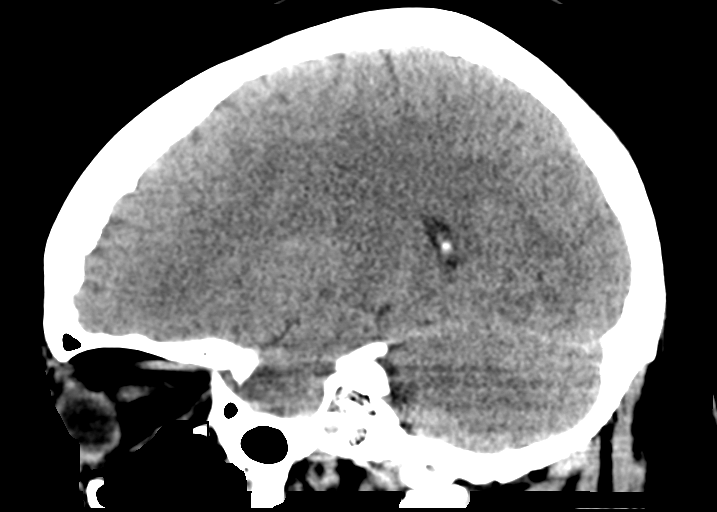

[16 of 47 positions shown; findings below may reference images not displayed]

FINDINGS: Brain: No evidence of acute infarction, hemorrhage, hydrocephalus,
extra-axial collection or mass lesion/mass effect.

Vascular: Negative for hyperdense vessel

Skull: Negative

Sinuses/Orbits: Negative

Other: None
IMPRESSION: Negative CT head

## 2018-11-19 ENCOUNTER — Encounter: Payer: Self-pay | Admitting: Emergency Medicine

## 2018-11-19 ENCOUNTER — Telehealth (INDEPENDENT_AMBULATORY_CARE_PROVIDER_SITE_OTHER): Payer: Managed Care, Other (non HMO) | Admitting: Emergency Medicine

## 2018-11-19 DIAGNOSIS — M546 Pain in thoracic spine: Secondary | ICD-10-CM

## 2018-11-19 DIAGNOSIS — M7918 Myalgia, other site: Secondary | ICD-10-CM

## 2018-11-19 MED ORDER — TRAMADOL HCL 50 MG PO TABS: 50.0000 mg | ORAL_TABLET | Freq: Three times a day (TID) | ORAL | 0 refills | Status: AC | PRN

## 2018-11-19 NOTE — Progress Notes (Signed)
Called patient to triage for Telemed appointment for 1:40 pm. Patient states she is having right side pain to stomach since yesterday. Patient states when she takes a deep breath it is very painful. Patient states she has been to the emergency room before, and was given medication to help.

## 2018-11-19 NOTE — Progress Notes (Signed)
Telemedicine Encounter- SOAP NOTE Established Patient Video communication attempted without success. This telephone encounter was conducted with the patient's (or proxy's) verbal consent via audio telecommunications: yes/no: Yes Patient was instructed to have this encounter in a suitably private space; and to only have persons present to whom they give permission to participate. In addition, patient identity was confirmed by use of name plus two identifiers (DOB and address).  I discussed the limitations, risks, security and privacy concerns of performing an evaluation and management service by telephone and the availability of in person appointments. I also discussed with the patient that there may be a patient responsible charge related to this service. The patient expressed understanding and agreed to proceed.  I spent a total of TIME; 0 MIN TO 60 MIN: 15 minutes talking with the patient or their proxy.  No chief complaint on file. Pain to right thoracic area.  Subjective   Pamela Cowan is a 43 y.o. female established patient. Telephone visit today for evaluation of pain to her right back thoracic area since last night.  Pain is sharp and constant.  Started when she was lying down on the sofa at home.  Felt worse this morning.  No difficulty breathing.  No pain radiation.  Able to eat and drink.  Denies nausea or vomiting.  No flulike symptoms.  Denies fever or chills.  Had similar event last September when she went to the emergency room.  Had extensive negative work-up.  Later sent home with a prescription for Naprosyn which helped.  No different symptoms at this time.  Denies any other significant symptomatology.  HPI   Patient Active Problem List   Diagnosis Date Noted  . Hepatitis C antibody test positive 03/06/2018    Past Medical History:  Diagnosis Date  . Diabetes mellitus without complication (HCC)    pt just put on metformin  . Ear infection     Current Outpatient  Medications  Medication Sig Dispense Refill  . cyclobenzaprine (FLEXERIL) 5 MG tablet Take 1 tablet (5 mg total) by mouth 3 (three) times daily as needed for muscle spasms. (Patient not taking: Reported on 11/19/2018) 60 tablet 0  . escitalopram (LEXAPRO) 10 MG tablet Take 1 tablet (10 mg total) by mouth daily. (Patient not taking: Reported on 11/19/2018) 90 tablet 0  . meloxicam (MOBIC) 7.5 MG tablet Take 1 tablet (7.5 mg total) by mouth daily. (Patient not taking: Reported on 11/19/2018) 30 tablet 0   No current facility-administered medications for this visit.     Allergies  Allergen Reactions  . Other Anaphylaxis    ALL seafoods- fish, shellfish, etc..  . Shellfish Allergy Anaphylaxis  . Escitalopram Other (See Comments)    Per patient caused appetite to increase    Social History   Socioeconomic History  . Marital status: Single    Spouse name: Not on file  . Number of children: 0  . Years of education: Not on file  . Highest education level: Some college, no degree  Occupational History  . Not on file  Social Needs  . Financial resource strain: Not hard at all  . Food insecurity:    Worry: Never true    Inability: Never true  . Transportation needs:    Medical: No    Non-medical: No  Tobacco Use  . Smoking status: Never Smoker  . Smokeless tobacco: Never Used  Substance and Sexual Activity  . Alcohol use: Not Currently    Alcohol/week: 1.0 standard drinks  Types: 1 Glasses of wine per week  . Drug use: Not Currently  . Sexual activity: Yes  Lifestyle  . Physical activity:    Days per week: 1 day    Minutes per session: 60 min  . Stress: To some extent  Relationships  . Social connections:    Talks on phone: Twice a week    Gets together: Never    Attends religious service: More than 4 times per year    Active member of club or organization: Yes    Attends meetings of clubs or organizations: More than 4 times per year    Relationship status: Living with  partner  . Intimate partner violence:    Fear of current or ex partner: No    Emotionally abused: No    Physically abused: No    Forced sexual activity: No  Other Topics Concern  . Not on file  Social History Narrative   Pt is from [place]. Moved to [place] 5 years. Has a partner of 8 years. Her cousin lives with her.     Review of Systems  Constitutional: Negative.  Negative for chills and fever.  HENT: Negative.  Negative for congestion, nosebleeds and sore throat.   Eyes: Negative.   Respiratory: Negative.  Negative for cough, hemoptysis, shortness of breath and wheezing.   Cardiovascular: Negative.  Negative for chest pain and palpitations.  Gastrointestinal: Negative for abdominal pain, diarrhea, nausea and vomiting.  Genitourinary: Negative.  Negative for dysuria and hematuria.  Musculoskeletal: Positive for back pain.  Skin: Negative.  Negative for rash.  Neurological: Negative.  Negative for dizziness and headaches.  Endo/Heme/Allergies: Negative.     Objective   Vitals as reported by the patient: None available There were no vitals filed for this visit. Awake and oriented x3 in no apparent respiratory distress. There are no diagnoses linked to this encounter. Diagnoses and all orders for this visit:  Acute right-sided thoracic back pain  Musculoskeletal pain -     traMADol (ULTRAM) 50 MG tablet; Take 1 tablet (50 mg total) by mouth every 8 (eight) hours as needed for moderate pain or severe pain.    No red flag signs or symptoms. Will treat symptoms and monitor. Patient advised to monitor her condition closely and schedule office visit if no better or worse in the next several days. I discussed the assessment and treatment plan with the patient. The patient was provided an opportunity to ask questions and all were answered. The patient agreed with the plan and demonstrated an understanding of the instructions.   The patient was advised to call back or seek  an in-person evaluation if the symptoms worsen or if the condition fails to improve as anticipated.  I provided 15 minutes of non-face-to-face time during this encounter.  Georgina Quint, MD  [facility] at [facility]

## 2018-12-03 ENCOUNTER — Ambulatory Visit: Payer: Managed Care, Other (non HMO) | Admitting: Registered Nurse

## 2018-12-07 ENCOUNTER — Encounter: Payer: Self-pay | Admitting: Registered Nurse

## 2018-12-07 ENCOUNTER — Other Ambulatory Visit: Payer: Self-pay

## 2018-12-07 ENCOUNTER — Ambulatory Visit: Payer: Managed Care, Other (non HMO) | Admitting: Registered Nurse

## 2018-12-07 VITALS — BP 120/81 | HR 108 | Temp 98.0°F | Resp 16 | Ht 67.0 in | Wt 291.0 lb

## 2018-12-07 DIAGNOSIS — R350 Frequency of micturition: Secondary | ICD-10-CM | POA: Diagnosis not present

## 2018-12-07 DIAGNOSIS — N3001 Acute cystitis with hematuria: Secondary | ICD-10-CM | POA: Diagnosis not present

## 2018-12-07 LAB — POCT URINALYSIS DIP (MANUAL ENTRY)
Bilirubin, UA: NEGATIVE
Glucose, UA: NEGATIVE mg/dL
Ketones, POC UA: NEGATIVE mg/dL
Nitrite, UA: NEGATIVE
Protein Ur, POC: NEGATIVE mg/dL
Spec Grav, UA: 1.02 (ref 1.010–1.025)
Urobilinogen, UA: 0.2 E.U./dL

## 2018-12-07 MED ORDER — NITROFURANTOIN MONOHYD MACRO 100 MG PO CAPS: 100.0000 mg | ORAL_CAPSULE | Freq: Two times a day (BID) | ORAL | 0 refills | Status: AC

## 2018-12-07 NOTE — Patient Instructions (Addendum)
   If you have lab work done today you will be contacted with your lab results within the next 2 weeks.  If you have not heard from us then please contact us. The fastest way to get your results is to register for My Chart.   IF you received an x-ray today, you will receive an invoice from [facility]. Please contact [facility] at [phone] with questions or concerns regarding your invoice.   IF you received labwork today, you will receive an invoice from [facility]. Please contact [facility] at [phone] with questions or concerns regarding your invoice.   Our billing staff will not be able to assist you with questions regarding bills from these companies.  You will be contacted with the lab results as soon as they are available. The fastest way to get your results is to activate your My Chart account. Instructions are located on the last page of this paperwork. If you have not heard from us regarding the results in 2 weeks, please contact this office.       Urinary Tract Infection, Adult  A urinary tract infection (UTI) is an infection of any part of the urinary tract. The urinary tract includes the kidneys, ureters, bladder, and urethra. These organs make, store, and get rid of urine in the body. Your health care provider may use other names to describe the infection. An upper UTI affects the ureters and kidneys (pyelonephritis). A lower UTI affects the bladder (cystitis) and urethra (urethritis). What are the causes? Most urinary tract infections are caused by bacteria in your genital area, around the entrance to your urinary tract (urethra). These bacteria grow and cause inflammation of your urinary tract. What increases the risk? You are more likely to develop this condition if:  You have a urinary catheter that stays in place (indwelling).  You are not able to control when you urinate or have a bowel movement (you have incontinence).  You are female and  you: ? Use a spermicide or diaphragm for birth control. ? Have low estrogen levels. ? Are pregnant.  You have certain genes that increase your risk (genetics).  You are sexually active.  You take antibiotic medicines.  You have a condition that causes your flow of urine to slow down, such as: ? An enlarged prostate, if you are female. ? Blockage in your urethra (stricture). ? A kidney stone. ? A nerve condition that affects your bladder control (neurogenic bladder). ? Not getting enough to drink, or not urinating often.  You have certain medical conditions, such as: ? Diabetes. ? A weak disease-fighting system (immunesystem). ? Sickle cell disease. ? Gout. ? Spinal cord injury. What are the signs or symptoms? Symptoms of this condition include:  Needing to urinate right away (urgently).  Frequent urination or passing small amounts of urine frequently.  Pain or burning with urination.  Blood in the urine.  Urine that smells bad or unusual.  Trouble urinating.  Cloudy urine.  Vaginal discharge, if you are female.  Pain in the abdomen or the lower back. You may also have:  Vomiting or a decreased appetite.  Confusion.  Irritability or tiredness.  A fever.  Diarrhea. The first symptom in older adults may be confusion. In some cases, they may not have any symptoms until the infection has worsened. How is this diagnosed? This condition is diagnosed based on your medical history and a physical exam. You may also have other tests, including:  Urine tests.  Blood tests.    Tests for sexually transmitted infections (STIs). If you have had more than one UTI, a cystoscopy or imaging studies may be done to determine the cause of the infections. How is this treated? Treatment for this condition includes:  Antibiotic medicine.  Over-the-counter medicines to treat discomfort.  Drinking enough water to stay hydrated. If you have frequent infections or have other  conditions such as a kidney stone, you may need to see a health care provider who specializes in the urinary tract (urologist). In rare cases, urinary tract infections can cause sepsis. Sepsis is a life-threatening condition that occurs when the body responds to an infection. Sepsis is treated in the hospital with IV antibiotics, fluids, and other medicines. Follow these instructions at home:  Medicines  Take over-the-counter and prescription medicines only as told by your health care provider.  If you were prescribed an antibiotic medicine, take it as told by your health care provider. Do not stop using the antibiotic even if you start to feel better. General instructions  Make sure you: ? Empty your bladder often and completely. Do not hold urine for long periods of time. ? Empty your bladder after sex. ? Wipe from front to back after a bowel movement if you are female. Use each tissue one time when you wipe.  Drink enough fluid to keep your urine pale yellow.  Keep all follow-up visits as told by your health care provider. This is important. Contact a health care provider if:  Your symptoms do not get better after 1-2 days.  Your symptoms go away and then return. Get help right away if you have:  Severe pain in your back or your lower abdomen.  A fever.  Nausea or vomiting. Summary  A urinary tract infection (UTI) is an infection of any part of the urinary tract, which includes the kidneys, ureters, bladder, and urethra.  Most urinary tract infections are caused by bacteria in your genital area, around the entrance to your urinary tract (urethra).  Treatment for this condition often includes antibiotic medicines.  If you were prescribed an antibiotic medicine, take it as told by your health care provider. Do not stop using the antibiotic even if you start to feel better.  Keep all follow-up visits as told by your health care provider. This is important. This information  is not intended to replace advice given to you by your health care provider. Make sure you discuss any questions you have with your health care provider. Document Released: 04/20/2005 Document Revised: 01/18/2018 Document Reviewed: 01/18/2018 Elsevier Interactive Patient Education  2019 [employer] Inc.   Pyelonephritis, Adult Pyelonephritis is a kidney infection. The kidneys are the organs that filter a person's blood and move waste out of the bloodstream and into the urine. Urine passes from the kidneys, through the ureters, and into the bladder. There are two main types of pyelonephritis:  Infections that come on quickly without any warning (acute pyelonephritis).  Infections that last for a long period of time (chronic pyelonephritis). In most cases, the infection clears up with treatment and does not cause further problems. More severe infections or chronic infections can sometimes spread to the bloodstream or lead to other problems with the kidneys. What are the causes? This condition is usually caused by:  Bacteria traveling from the bladder to the kidney through infected urine. The urine in the bladder can become infected with bacteria from: ? Bladder infection (cystitis). ? Inflammation of the prostate gland (prostatitis). ? Sexual intercourse, in females.  Bacteria   traveling from the bloodstream to the kidney. What increases the risk? This condition is more likely to develop in:  Pregnant women.  Older people.  People who have diabetes.  People who have kidney stones or bladder stones.  People who have other abnormalities of the kidney or ureter.  People who have a catheter placed in the bladder.  People who have cancer.  People who are sexually active.  Women who use spermicides.  People who have had a prior urinary tract infection. What are the signs or symptoms? Symptoms of this condition include:  Frequent urination.  Strong or persistent urge to  urinate.  Burning or stinging when urinating.  Abdominal pain.  Back pain.  Pain in the side or flank area.  Fever.  Chills.  Blood in the urine, or dark urine.  Nausea.  Vomiting. How is this diagnosed? This condition may be diagnosed based on:  Medical history and physical exam.  Urine tests.  Blood tests. You may also have imaging tests of the kidneys, such as an ultrasound or CT scan. How is this treated? Treatment for this condition may depend on the severity of the infection.  If the infection is mild and is found early, you may be treated with antibiotic medicines taken by mouth. You will need to drink fluids to remain hydrated.  If the infection is more severe, you may need to stay in the hospital and receive antibiotics given directly into a vein through an IV tube. You may also need to receive fluids through an IV tube if you are not able to remain hydrated. After your hospital stay, you may need to take oral antibiotics for a period of time. Other treatments may be required, depending on the cause of the infection. Follow these instructions at home: Medicines  Take over-the-counter and prescription medicines only as told by your health care provider.  If you were prescribed an antibiotic medicine, take it as told by your health care provider. Do not stop taking the antibiotic even if you start to feel better. General instructions  Drink enough fluid to keep your urine clear or pale yellow.  Avoid caffeine, tea, and carbonated beverages. They tend to irritate the bladder.  Urinate often. Avoid holding in urine for long periods of time.  Urinate before and after sex.  After a bowel movement, women should cleanse from front to back. Use each tissue only once.  Keep all follow-up visits as told by your health care provider. This is important. Contact a health care provider if:  Your symptoms do not get better after 2 days of treatment.  Your symptoms  get worse.  You have a fever. Get help right away if:  You are unable to take your antibiotics or fluids.  You have shaking chills.  You vomit.  You have severe flank or back pain.  You have extreme weakness or fainting. This information is not intended to replace advice given to you by your health care provider. Make sure you discuss any questions you have with your health care provider. Document Released: 07/11/2005 Document Revised: 12/17/2015 Document Reviewed: 11/03/2014 Elsevier Interactive Patient Education  2019 Elsevier Inc.  

## 2018-12-07 NOTE — Progress Notes (Signed)
Established Patient Office Visit  Subjective:  Patient ID: Pamela Cowan, female    DOB: August 02, 1975  Age: 43 y.o. MRN: [id number]  CC:  Chief Complaint  Patient presents with  . Urinary Frequency  . Back Pain    x 2 weeks     HPI Pamela Cowan presents for Urinary urgency  Starting over 1 week prior to visit with some discomfort radiating up left flank. Constant duration. Dysuria. No gross blood in urine. Has had UTIs previously, feels similar. Denies aggravating and alleviating factors.  Pt has family hx of nephrolithiases, will continue to monitor this  Past Medical History:  Diagnosis Date  . Diabetes mellitus without complication (HCC)    pt just put on metformin  . Ear infection     Past Surgical History:  Procedure Laterality Date  . arm surgery     right   . FRACTURE SURGERY      Family History  Problem Relation Age of Onset  . Diabetes Mother   . Hypertension Mother   . Cancer Father   . Hyperlipidemia Maternal Grandmother   . Hypertension Maternal Grandmother   . Diabetes Maternal Grandfather   . Mental illness Maternal Grandfather   . Dementia Paternal Grandmother     Social History   Socioeconomic History  . Marital status: Single    Spouse name: Not on file  . Number of children: 0  . Years of education: Not on file  . Highest education level: Some college, no degree  Occupational History  . Not on file  Social Needs  . Financial resource strain: Not hard at all  . Food insecurity:    Worry: Never true    Inability: Never true  . Transportation needs:    Medical: No    Non-medical: No  Tobacco Use  . Smoking status: Never Smoker  . Smokeless tobacco: Never Used  Substance and Sexual Activity  . Alcohol use: Not Currently    Alcohol/week: 1.0 standard drinks    Types: 1 Glasses of wine per week  . Drug use: Not Currently  . Sexual activity: Yes  Lifestyle  . Physical activity:    Days per week: 1 day    Minutes per session: 60  min  . Stress: To some extent  Relationships  . Social connections:    Talks on phone: Twice a week    Gets together: Never    Attends religious service: More than 4 times per year    Active member of club or organization: Yes    Attends meetings of clubs or organizations: More than 4 times per year    Relationship status: Living with partner  . Intimate partner violence:    Fear of current or ex partner: No    Emotionally abused: No    Physically abused: No    Forced sexual activity: No  Other Topics Concern  . Not on file  Social History Narrative   Pt is from [place]. Moved to [place] 5 years. Has a partner of 8 years. Her cousin lives with her.     Outpatient Medications Prior to Visit  Medication Sig Dispense Refill  . cyclobenzaprine (FLEXERIL) 5 MG tablet Take 1 tablet (5 mg total) by mouth 3 (three) times daily as needed for muscle spasms. 60 tablet 0  . escitalopram (LEXAPRO) 10 MG tablet Take 1 tablet (10 mg total) by mouth daily. 90 tablet 0  . meloxicam (MOBIC) 7.5 MG tablet Take 1 tablet (7.5 mg total)  by mouth daily. 30 tablet 0  . traMADol (ULTRAM) 50 MG tablet Take 1 tablet (50 mg total) by mouth every 8 (eight) hours as needed for moderate pain or severe pain. 15 tablet 0   No facility-administered medications prior to visit.     Allergies  Allergen Reactions  . Other Anaphylaxis    ALL seafoods- fish, shellfish, etc..  . Shellfish Allergy Anaphylaxis  . Escitalopram Other (See Comments)    Per patient caused appetite to increase    ROS Review of Systems  Constitutional: Negative for chills, diaphoresis and fever.  HENT: Negative.   Eyes: Negative.   Respiratory: Negative.   Cardiovascular: Negative.   Gastrointestinal: Negative.   Endocrine: Negative.   Genitourinary: Positive for dysuria, flank pain, frequency and urgency. Negative for hematuria, vaginal bleeding, vaginal discharge and vaginal pain.  Skin: Negative.   Allergic/Immunologic:  Negative.   Neurological: Negative.   Hematological: Negative.   Psychiatric/Behavioral: Negative.       Objective:    Physical Exam  Constitutional: She is oriented to person, place, and time. She appears well-developed and well-nourished.  HENT:  Head: Normocephalic and atraumatic.  Neck: Normal range of motion. Neck supple.  Neurological: She is alert and oriented to person, place, and time.  Skin: Skin is warm and dry.  Psychiatric: She has a normal mood and affect. Her behavior is normal. Judgment and thought content normal.  NO CV ANGLE TENDERNESS   BP 120/81   Pulse (!) 108   Temp 98 F (36.7 C) (Oral)   Resp 16   Ht  (1.702 m)   Wt 291 lb (132 kg)   SpO2 98%   BMI 45.58 kg/m  Wt Readings from Last 3 Encounters:  12/07/18 291 lb (132 kg)  09/04/18 295 lb 6.4 oz (134 kg)  03/27/18 287 lb 3.2 oz (130.3 kg)     There are no preventive care reminders to display for this patient.  There are no preventive care reminders to display for this patient.  Lab Results  Component Value Date   TSH 1.470 09/04/2018   Lab Results  Component Value Date   WBC 7.1 09/04/2018   HGB 10.4 (L) 09/04/2018   HCT 32.0 (L) 09/04/2018   MCV 78 (L) 09/04/2018   PLT 388 09/04/2018   Lab Results  Component Value Date   NA 141 09/04/2018   K 4.4 09/04/2018   CO2 24 09/04/2018   GLUCOSE 79 09/04/2018   BUN 8 09/04/2018   CREATININE 0.95 09/04/2018   BILITOT 0.4 09/04/2018   ALKPHOS 100 09/04/2018   AST 14 09/04/2018   ALT 10 09/04/2018   PROT 8.2 09/04/2018   ALBUMIN 3.9 09/04/2018   CALCIUM 9.2 09/04/2018   ANIONGAP 7 08/16/2017   Lab Results  Component Value Date   CHOL 214 (H) 09/04/2018   Lab Results  Component Value Date   HDL 57 09/04/2018   Lab Results  Component Value Date   LDLCALC 129 (H) 09/04/2018   Lab Results  Component Value Date   TRIG 139 09/04/2018   Lab Results  Component Value Date   CHOLHDL 3.8 09/04/2018   Lab Results   Component Value Date   HGBA1C 5.6 09/04/2018      Assessment & Plan:   Problem List Items Addressed This Visit    None    Visit Diagnoses    Urinary frequency    -  Primary   Relevant Orders   POCT urinalysis dipstick (Completed)  Urine Culture      No orders of the defined types were placed in this encounter.  PLAN:  Likely UTI given sxs and lack of CVA tenderness - differential would be pyelo or nephrolithiases, will order KUB and refer if sxs fail to improve.   Macrobid 100mg  PO bid for 5 days, Azo suggested as OTC sxs relief, can use OTC analgesics sparingly if necessary  Call clinic by Tuesday if sxs do not resolve  Patient encouraged to call clinic with any questions, comments, or concerns.    Follow-up: PRN if symptoms worsen or fail to improve   I spent 12 minutes with the patient, more than 50% of which was spent on counseling and education.  Thank you for your visit with Primary Care at [facility] today.  Janeece Agee, NP [facility] at [facility] [address] [place], [place] [address] [phone] - 0000     [id number], Eilleen Kempf, MD Chief Complaint  Patient presents with  . Urinary Frequency  . Back Pain    x 2 weeks     Current Issues:  Presents with *** days of  Associated symptoms include:    of similar symptoms. Sexually active:      for STI.  Prior to Admission medications   Medication Sig Start Date End Date Taking? Authorizing Provider  cyclobenzaprine (FLEXERIL) 5 MG tablet Take 1 tablet (5 mg total) by mouth 3 (three) times daily as needed for muscle spasms. 03/27/18  Yes Barnett Abu, [place] D, PA-C  escitalopram (LEXAPRO) 10 MG tablet Take 1 tablet (10 mg total) by mouth daily. 03/27/18  Yes Barnett Abu, [place] D, PA-C  meloxicam (MOBIC) 7.5 MG tablet Take 1 tablet (7.5 mg total) by mouth daily. 03/27/18  Yes Barnett Abu, [place] D, PA-C  traMADol (ULTRAM) 50 MG tablet Take 1 tablet (50 mg total) by mouth every 8 (eight) hours as needed for  moderate pain or severe pain. 11/19/18  Yes Sagardia, Eilleen Kempf, MD  nitrofurantoin, macrocrystal-monohydrate, (MACROBID) 100 MG capsule Take 1 capsule (100 mg total) by mouth 2 (two) times daily for 5 days. 12/07/18 12/12/18  Janeece Agee, NP    Review of Systems:***  PE:  BP 120/81   Pulse (!) 108   Temp 98 F (36.7 C) (Oral)   Resp 16   Ht 5\' 7"  (1.702 m)   Wt 291 lb (132 kg)   SpO2 98%   BMI 45.58 kg/m  Constitutional*** Heart*** Lungs*** Back*** Abdomen*** Pelvic***  Results for orders placed or performed in visit on 12/07/18  POCT urinalysis dipstick  Result Value Ref Range   Color, UA yellow yellow   Clarity, UA cloudy (A) clear   Glucose, UA negative negative mg/dL   Bilirubin, UA negative negative   Ketones, POC UA negative negative mg/dL   Spec Grav, UA 6.295 2.841 - 1.025   Blood, UA trace-lysed (A) negative   pH, UA     Protein Ur, POC negative negative mg/dL   Urobilinogen, UA 0.2 0.2 or 1.0 E.U./dL   Nitrite, UA Negative Negative   Leukocytes, UA Small (1+) (A) Negative    Assessment and Plan:  1. Urinary frequency *** - POCT urinalysis dipstick - Urine Culture - nitrofurantoin, macrocrystal-monohydrate, (MACROBID) 100 MG capsule; Take 1 capsule (100 mg total) by mouth 2 (two) times daily for 5 days.  Dispense: 10 capsule; Refill: 0  2. Acute cystitis with hematuria ***

## 2018-12-09 LAB — URINE CULTURE

## 2020-12-24 ENCOUNTER — Other Ambulatory Visit: Payer: Self-pay | Admitting: Obstetrics and Gynecology

## 2020-12-24 DIAGNOSIS — Z1231 Encounter for screening mammogram for malignant neoplasm of breast: Secondary | ICD-10-CM

## 2021-02-01 ENCOUNTER — Inpatient Hospital Stay: Admission: RE | Admit: 2021-02-01 | Payer: Self-pay | Source: Ambulatory Visit

## 2021-02-01 DIAGNOSIS — Z1231 Encounter for screening mammogram for malignant neoplasm of breast: Secondary | ICD-10-CM
